# Patient Record
Sex: Female | Born: 1995 | Race: White | Hispanic: No | Smoking: Never smoker
Health system: Southern US, Community
[De-identification: ages and names within clinical notes are randomized; demographics above are authoritative.]

## PROBLEM LIST (undated history)

## (undated) DIAGNOSIS — N912 Amenorrhea, unspecified: Secondary | ICD-10-CM

## (undated) DIAGNOSIS — Z789 Other specified health status: Secondary | ICD-10-CM

## (undated) HISTORY — PX: FOOT SURGERY: SHX648

## (undated) HISTORY — DX: Amenorrhea, unspecified: N91.2

## (undated) HISTORY — DX: Other specified health status: Z78.9

## (undated) HISTORY — PX: PARTIAL MASTECTOMY: SHX2703

---
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
## 2014-01-31 ENCOUNTER — Ambulatory Visit: Payer: Self-pay | Admitting: Family Medicine

## 2015-05-22 ENCOUNTER — Telehealth: Payer: Self-pay | Admitting: Obstetrics and Gynecology

## 2015-05-22 ENCOUNTER — Other Ambulatory Visit: Payer: Self-pay | Admitting: *Deleted

## 2015-05-22 ENCOUNTER — Other Ambulatory Visit: Payer: Self-pay | Admitting: Obstetrics and Gynecology

## 2015-05-22 MED ORDER — CLOTRIMAZOLE-BETAMETHASONE 1-0.05 % EX CREA: 1.0000 "application " | TOPICAL_CREAM | Freq: Two times a day (BID) | CUTANEOUS | Status: DC

## 2015-05-22 NOTE — Telephone Encounter (Signed)
[place], [place] DID NOT HAVE THE R[place], RED, IRRATATED AND ITCHY, ON THE SIDE AND GOING UP, IT IS SPREADING FROM YESTERDAY, SHE I SUSING AN ANTIBIOTIC CREAM, THE CREAM WAS GIVEN TO THE MOTHER FOR A SPIDER BIT AND SHE THOUGHT IT WOULD HELP CLEAR IT UP, NO FEVER AND NO NVD, WANTED TO KNOW IF SHE NEEDED TO COME IN AND BE SEEN

## 2015-05-22 NOTE — Telephone Encounter (Signed)
Printer RX for a different cream, apply as directed and if not resolved within 1 week want her to come in to be seen

## 2015-05-22 NOTE — Telephone Encounter (Signed)
Called mother and advised of MNB message rx faxed to [facility] [address]

## 2015-08-27 ENCOUNTER — Telehealth: Payer: Self-pay | Admitting: Obstetrics and Gynecology

## 2015-08-27 NOTE — Telephone Encounter (Signed)
She is going to [place] and there timing of taking her pill will be off she needs to know how to take.

## 2015-08-27 NOTE — Telephone Encounter (Signed)
pls advise

## 2015-08-29 NOTE — Telephone Encounter (Signed)
Please call mom [phone] today, about how Natalie Walsh should take her BC pills since she is going to [place]. Natalie Walsh is in school today

## 2015-08-29 NOTE — Telephone Encounter (Signed)
She can take them continuously omitting placebo pills to prevent period while she is there. Does she need pharmacy autherization to give enough packs to do that? If so, how long will she be gone?

## 2015-08-30 NOTE — Telephone Encounter (Signed)
Notified mother she voiced understanding.

## 2015-09-27 DIAGNOSIS — J029 Acute pharyngitis, unspecified: Secondary | ICD-10-CM | POA: Diagnosis not present

## 2015-09-27 DIAGNOSIS — J069 Acute upper respiratory infection, unspecified: Secondary | ICD-10-CM | POA: Diagnosis not present

## 2015-10-04 ENCOUNTER — Telehealth: Payer: Self-pay | Admitting: Obstetrics and Gynecology

## 2015-10-04 ENCOUNTER — Other Ambulatory Visit: Payer: Self-pay | Admitting: Obstetrics and Gynecology

## 2015-10-04 MED ORDER — ONDANSETRON 4 MG PO TBDP: 4.0000 mg | ORAL_TABLET | Freq: Four times a day (QID) | ORAL | Status: DC | PRN

## 2015-10-04 NOTE — Telephone Encounter (Signed)
Please fax rx for her

## 2015-10-04 NOTE — Telephone Encounter (Signed)
Natalie Walsh is fixing to get on a plane to come home from college. She had a stomach virus and her mom wants to know if you can send an RX for nausea to a pharmacy where she is so she is at ([facility]- [phone]) [place], [place]

## 2015-10-05 NOTE — Telephone Encounter (Signed)
Faxed to correct pharmacy 

## 2015-11-09 DIAGNOSIS — R5383 Other fatigue: Secondary | ICD-10-CM

## 2015-11-21 ENCOUNTER — Ambulatory Visit (INDEPENDENT_AMBULATORY_CARE_PROVIDER_SITE_OTHER): Payer: BLUE CROSS/BLUE SHIELD | Admitting: Obstetrics and Gynecology

## 2015-11-21 ENCOUNTER — Encounter: Payer: Self-pay | Admitting: Obstetrics and Gynecology

## 2015-11-21 VITALS — BP 120/67 | HR 59 | Ht 68.0 in | Wt 147.5 lb

## 2015-11-21 DIAGNOSIS — R5383 Other fatigue: Secondary | ICD-10-CM

## 2015-11-21 DIAGNOSIS — G479 Sleep disorder, unspecified: Secondary | ICD-10-CM | POA: Diagnosis not present

## 2015-11-21 DIAGNOSIS — N911 Secondary amenorrhea: Secondary | ICD-10-CM

## 2015-11-21 MED ORDER — CLONAZEPAM 0.5 MG PO TABS: 0.5000 mg | ORAL_TABLET | Freq: Every day | ORAL | Status: DC

## 2015-11-21 NOTE — Progress Notes (Signed)
HPI Reports onset fatigue and decreased endurance in Nov. Feels like it has gotten worse over last few months. 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 Not sleeping well, and doesn't feel rested when she gets up, wakes up multiple times a night- tried Melatonin last 2 weeks with no improvement. Also started on some supplements by holistic doctor and trainer and can't tell a difference. Had labs drawn 3 weeks ago and told she had signs of adrenal fatigue and is overworked training.  Feels depressed over inability to keep up with teammates  Review of Systems See above    Objective:   Physical Exam A&O x4  well groomed female in no distress  HRR lung clear Thyroid normal  Abdomen soft and nontender Blood pressure 127/72, pulse 67, weight 180 lb 4.8 oz (81.784 kg), last menstrual period 11/14/2015.    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 Recheck in 2 weeks, and instructed patient to bring previous labs results and current OTC supplement meds with her to review.  Natalie Walsh [place], CNM

## 2015-11-22 ENCOUNTER — Telehealth: Payer: Self-pay | Admitting: *Deleted

## 2015-11-22 LAB — CBC
HEMOGLOBIN: 14.5 g/dL (ref 11.1–15.9)
Hematocrit: 43.6 % (ref 34.0–46.6)
MCH: 30.1 pg (ref 26.6–33.0)
MCHC: 33.3 g/dL (ref 31.5–35.7)
MCV: 91 fL (ref 79–97)
Platelets: 321 10*3/uL (ref 150–379)
RBC: 4.82 x10E6/uL (ref 3.77–5.28)
RDW: 13.3 % (ref 12.3–15.4)
WBC: 8.3 10*3/uL (ref 3.4–10.8)

## 2015-11-22 LAB — COMPREHENSIVE METABOLIC PANEL
ALBUMIN: 4.7 g/dL (ref 3.5–5.5)
ALT: 18 IU/L (ref 0–32)
AST: 26 IU/L (ref 0–40)
Albumin/Globulin Ratio: 1.6 (ref 1.1–2.5)
Alkaline Phosphatase: 59 IU/L (ref 39–117)
BUN / CREAT RATIO: 23 — AB (ref 8–20)
BUN: 18 mg/dL (ref 6–20)
Bilirubin Total: 0.5 mg/dL (ref 0.0–1.2)
CO2: 20 mmol/L (ref 18–29)
CREATININE: 0.78 mg/dL (ref 0.57–1.00)
Calcium: 9.8 mg/dL (ref 8.7–10.2)
Chloride: 99 mmol/L (ref 96–106)
GFR, EST AFRICAN AMERICAN: 126 mL/min/{1.73_m2} (ref >59)
GFR, EST NON AFRICAN AMERICAN: 109 mL/min/{1.73_m2} (ref >59)
GLUCOSE: 70 mg/dL (ref 65–99)
Globulin, Total: 3 g/dL (ref 1.5–4.5)
Potassium: 4.7 mmol/L (ref 3.5–5.2)
Sodium: 139 mmol/L (ref 134–144)
TOTAL PROTEIN: 7.7 g/dL (ref 6.0–8.5)

## 2015-11-22 LAB — THYROID PANEL WITH TSH
FREE THYROXINE INDEX: 2.3 (ref 1.2–4.9)
T3 UPTAKE RATIO: 25 % (ref 24–39)
T4, Total: 9.3 ug/dL (ref 4.5–12.0)
TSH: 1.55 u[IU]/mL (ref 0.450–4.500)

## 2015-11-22 LAB — PROLACTIN: PROLACTIN: 7 ng/mL (ref 4.8–23.3)

## 2015-11-22 LAB — VITAMIN D 25 HYDROXY (VIT D DEFICIENCY, FRACTURES): Vit D, 25-Hydroxy: 62.1 ng/mL (ref 30.0–100.0)

## 2015-11-22 LAB — CORTISOL: CORTISOL: 8.1 ug/dL

## 2015-11-22 LAB — VITAMIN B12: Vitamin B-12: 539 pg/mL (ref 211–946)

## 2015-11-22 LAB — IRON: Iron: 99 ug/dL (ref 27–159)

## 2015-11-22 LAB — ACTH

## 2015-11-22 NOTE — Telephone Encounter (Signed)
Left pt detailed message about her labs 

## 2015-11-22 NOTE — Telephone Encounter (Signed)
-----   Message from Purcell Nails, [place] sent at 11/22/2015  3:40 PM EST ----- Please let her know all labs were normal.

## 2015-11-24 ENCOUNTER — Encounter: Payer: Self-pay | Admitting: Obstetrics and Gynecology

## 2015-12-11 ENCOUNTER — Ambulatory Visit (INDEPENDENT_AMBULATORY_CARE_PROVIDER_SITE_OTHER): Payer: BLUE CROSS/BLUE SHIELD | Admitting: Obstetrics and Gynecology

## 2015-12-11 ENCOUNTER — Encounter: Payer: Self-pay | Admitting: Obstetrics and Gynecology

## 2015-12-11 VITALS — BP 118/68 | HR 64 | Wt 148.5 lb

## 2015-12-11 DIAGNOSIS — Z5181 Encounter for therapeutic drug level monitoring: Secondary | ICD-10-CM | POA: Diagnosis not present

## 2015-12-11 NOTE — Progress Notes (Signed)
Patient ID: Natalie Walsh, female   DOB: 1995-11-16, 20 y.o.   MRN: [id number]   Here for medication follow-up from 2 weeks ago:  S: reports slept much better with Klonopin use, but stopped it as her parents didn't want her taking it. Feels like she has more energy since starting Ashwaghanda. Has been working on stress reduction and prioritizing, and feels more in control of things.  O: A&O x4  well groomed female in no distress Blood pressure 118/68, pulse 64, weight 148 lb 8 oz (67.359 kg).  A: fatigue improved  P: will use Klonopin on prn basis as directed. To continue Ashwaghanda daily for now Already has AE scheduled.  Melody [place], CNM

## 2016-01-08 ENCOUNTER — Encounter: Payer: Self-pay | Admitting: Obstetrics and Gynecology

## 2016-01-14 ENCOUNTER — Ambulatory Visit (INDEPENDENT_AMBULATORY_CARE_PROVIDER_SITE_OTHER): Payer: BLUE CROSS/BLUE SHIELD | Admitting: Family Medicine

## 2016-01-14 ENCOUNTER — Encounter: Payer: Self-pay | Admitting: Family Medicine

## 2016-01-14 DIAGNOSIS — M79672 Pain in left foot: Secondary | ICD-10-CM | POA: Diagnosis not present

## 2016-01-14 NOTE — Progress Notes (Signed)
Patient ID: Natalie Walsh, female   DOB: 07/16/1996, 20 y.o.   MRN: [id number] Patient presents today with symptoms of left foot pain. Patient states that she dropped a 5 pound weight on her left foot back in November 2016. She treated it like a contusion at the time and ice the foot. She then went to grease and did a lot of running and walking. She is continued to feel some discomfort in the foot since the injury. She denies any new injury since that time. She has noticed the pain increased and she is started out or track and field. She competes in hurdles. She denies any history of stress injury to her foot in the past. She does not wear orthotics. She was put in a walking boot for a short period of time by the trainer which did help. She denies any swelling recently or bruising.  ROS: Negative except mentioned above.  Vitals as per Epic.  GENERAL: NAD RESP: CTA B CARD: RRR MSK: Left Foot: pes planus with overpronation, mild to moderate tenderness along navicular and proximal 1st metatarsal, no ecchymosis or swelling,FROM, nv intact  NEURO: CN II-XII grossly intact   A/P: Hx of left foot injury with chronic pain- will do x-rays initially, if negative will proceed with doing MRI given location of pain and chronicity of symptoms. I recommend that she go into a walking boot at this time. I discussed with her on getting off the shelf orthotics. She can take NSAIDs if needed for pain. Discussed changing her running shoes every 400 or so miles. Will discuss plan above with trainer.

## 2016-01-16 ENCOUNTER — Ambulatory Visit
Admission: RE | Admit: 2016-01-16 | Discharge: 2016-01-16 | Disposition: A | Discharge status: Home / Self Care | Payer: BLUE CROSS/BLUE SHIELD | Source: Ambulatory Visit | Attending: Family Medicine | Admitting: Family Medicine

## 2016-01-16 DIAGNOSIS — M79672 Pain in left foot: Secondary | ICD-10-CM | POA: Insufficient documentation

## 2016-01-17 ENCOUNTER — Other Ambulatory Visit: Payer: Self-pay | Admitting: Family Medicine

## 2016-01-17 DIAGNOSIS — G8929 Other chronic pain: Secondary | ICD-10-CM

## 2016-01-17 DIAGNOSIS — M79672 Pain in left foot: Principal | ICD-10-CM

## 2016-01-24 ENCOUNTER — Ambulatory Visit
Admission: RE | Admit: 2016-01-24 | Discharge: 2016-01-24 | Disposition: A | Discharge status: Home / Self Care | Payer: BLUE CROSS/BLUE SHIELD | Source: Ambulatory Visit | Attending: Family Medicine | Admitting: Family Medicine

## 2016-01-24 DIAGNOSIS — T148 Other injury of unspecified body region: Secondary | ICD-10-CM | POA: Insufficient documentation

## 2016-01-24 DIAGNOSIS — G8929 Other chronic pain: Secondary | ICD-10-CM | POA: Diagnosis present

## 2016-01-24 DIAGNOSIS — M79672 Pain in left foot: Secondary | ICD-10-CM | POA: Insufficient documentation

## 2016-02-05 ENCOUNTER — Other Ambulatory Visit: Payer: Self-pay | Admitting: Family Medicine

## 2016-02-05 DIAGNOSIS — M79672 Pain in left foot: Secondary | ICD-10-CM

## 2016-02-06 ENCOUNTER — Ambulatory Visit
Admission: RE | Admit: 2016-02-06 | Discharge: 2016-02-06 | Disposition: A | Discharge status: Home / Self Care | Payer: BLUE CROSS/BLUE SHIELD | Source: Ambulatory Visit | Attending: Family Medicine | Admitting: Family Medicine

## 2016-02-06 DIAGNOSIS — M79672 Pain in left foot: Secondary | ICD-10-CM

## 2016-02-06 DIAGNOSIS — M25775 Osteophyte, left foot: Secondary | ICD-10-CM | POA: Diagnosis not present

## 2016-02-06 DIAGNOSIS — M84375A Stress fracture, left foot, initial encounter for fracture: Secondary | ICD-10-CM | POA: Insufficient documentation

## 2016-02-06 DIAGNOSIS — X58XXXA Exposure to other specified factors, initial encounter: Secondary | ICD-10-CM | POA: Insufficient documentation

## 2016-02-11 ENCOUNTER — Encounter: Payer: Self-pay | Admitting: Family Medicine

## 2016-02-11 ENCOUNTER — Ambulatory Visit (INDEPENDENT_AMBULATORY_CARE_PROVIDER_SITE_OTHER): Payer: BLUE CROSS/BLUE SHIELD | Admitting: Family Medicine

## 2016-02-11 VITALS — BP 119/71 | HR 69 | Resp 14

## 2016-02-11 DIAGNOSIS — M84375A Stress fracture, left foot, initial encounter for fracture: Secondary | ICD-10-CM

## 2016-02-11 NOTE — Progress Notes (Signed)
Patient ID: Apolonio SchneidersLydia Walsh, female   DOB: 02/05/1996, 20 y.o.   MRN: [id number] Patient presents today regarding her left navicular stress fracture. Patient states that she has no questions and is okay with proceeding with the non-surgical route of treatment for now. The plan is for her to be nonweightbearing in a boot and follow-up with Dr. Ardine Engiehl this summer. If she continues to have pain or no healing at 3 months then surgical treatment will be likely. I did check and see if she had a vitamin D level drawn recently. It appears that she had a vitamin D level drawn in February 2017. The level was 61. I do not feel that there is any need for supplementation a vitamin D at this point. Dr. Ardine Engiehl did discuss with me on ordering a bone stimulator. I will discuss this further with the trainer and write an order if needed.

## 2016-02-14 ENCOUNTER — Encounter: Payer: Self-pay | Admitting: *Deleted

## 2016-02-20 ENCOUNTER — Other Ambulatory Visit: Payer: Self-pay | Admitting: Obstetrics and Gynecology

## 2016-02-20 ENCOUNTER — Encounter: Payer: Self-pay | Admitting: Obstetrics and Gynecology

## 2016-02-20 ENCOUNTER — Ambulatory Visit (INDEPENDENT_AMBULATORY_CARE_PROVIDER_SITE_OTHER): Payer: BLUE CROSS/BLUE SHIELD | Admitting: Obstetrics and Gynecology

## 2016-02-20 DIAGNOSIS — N926 Irregular menstruation, unspecified: Secondary | ICD-10-CM

## 2016-02-20 DIAGNOSIS — Z01419 Encounter for gynecological examination (general) (routine) without abnormal findings: Secondary | ICD-10-CM

## 2016-02-20 NOTE — Progress Notes (Signed)
  Subjective:     Natalie Walsh is a 20 y.o. female and is here for a comprehensive physical exam. The patient reports no problems.  Social History   Social History  . Marital Status: Single    Spouse Name: N/A  . Number of Children: N/A  . Years of Education: N/A   Occupational History  . Not on file.   Social History Main Topics  . Smoking status: Never Smoker   . Smokeless tobacco: Never Used  . Alcohol Use: No  . Drug Use: No  . Sexual Activity: Not Currently   Other Topics Concern  . Not on file   Social History Narrative   Health Maintenance  Topic Date Due  . HIV Screening  11/20/2009  . TETANUS/TDAP  11/20/2013  . PAP SMEAR  11/21/2015  . INFLUENZA VACCINE  05/13/2016    The following portions of the patient's history were reviewed and updated as appropriate: allergies, current medications, past family history, past medical history, past social history, past surgical history and problem list.  Review of Systems A comprehensive review of systems was negative.   Objective:    General appearance: alert, cooperative and appears stated age Neck: no adenopathy, no carotid bruit, no JVD, supple, symmetrical, trachea midline and thyroid not enlarged, symmetric, no tenderness/mass/nodules Lungs: clear to auscultation bilaterally Breasts: normal appearance, no masses or tenderness Heart: regular rate and rhythm, S1, S2 normal, no murmur, click, rub or gallop Abdomen: soft, non-tender; bowel sounds normal; no masses,  no organomegaly Pelvic: cervix normal in appearance, external genitalia normal, no adnexal masses or tenderness, no cervical motion tenderness, rectovaginal septum normal, uterus normal size, shape, and consistency and vagina normal without discharge    Assessment:    Healthy female exam. H/O irregular menses      Plan:  Will chart menses and let me know if skips >3 months   See After Visit Summary for Counseling Recommendations

## 2016-02-20 NOTE — Patient Instructions (Signed)
Place annual gynecologic exam patient instructions here.

## 2016-02-22 LAB — CYTOLOGY - PAP

## 2016-03-14 ENCOUNTER — Other Ambulatory Visit: Payer: Self-pay | Admitting: Family Medicine

## 2016-03-14 DIAGNOSIS — S92902P Unspecified fracture of left foot, subsequent encounter for fracture with malunion: Secondary | ICD-10-CM

## 2016-03-17 ENCOUNTER — Ambulatory Visit
Admission: RE | Admit: 2016-03-17 | Discharge: 2016-03-17 | Disposition: A | Discharge status: Home / Self Care | Payer: BLUE CROSS/BLUE SHIELD | Source: Ambulatory Visit | Attending: Family Medicine | Admitting: Family Medicine

## 2016-03-17 DIAGNOSIS — X58XXXD Exposure to other specified factors, subsequent encounter: Secondary | ICD-10-CM | POA: Insufficient documentation

## 2016-03-17 DIAGNOSIS — S92902D Unspecified fracture of left foot, subsequent encounter for fracture with routine healing: Secondary | ICD-10-CM | POA: Insufficient documentation

## 2016-03-17 DIAGNOSIS — S92902P Unspecified fracture of left foot, subsequent encounter for fracture with malunion: Secondary | ICD-10-CM

## 2016-04-08 ENCOUNTER — Ambulatory Visit
Admission: RE | Admit: 2016-04-08 | Discharge: 2016-04-08 | Disposition: A | Discharge status: Home / Self Care | Payer: BLUE CROSS/BLUE SHIELD | Source: Ambulatory Visit | Attending: Family Medicine | Admitting: Family Medicine

## 2016-04-08 ENCOUNTER — Other Ambulatory Visit: Payer: Self-pay | Admitting: Family Medicine

## 2016-04-08 DIAGNOSIS — M79672 Pain in left foot: Secondary | ICD-10-CM | POA: Diagnosis present

## 2016-04-08 DIAGNOSIS — R52 Pain, unspecified: Secondary | ICD-10-CM

## 2016-05-08 ENCOUNTER — Encounter: Payer: Self-pay | Admitting: Family Medicine

## 2016-05-08 ENCOUNTER — Ambulatory Visit (INDEPENDENT_AMBULATORY_CARE_PROVIDER_SITE_OTHER): Payer: BLUE CROSS/BLUE SHIELD | Admitting: Family Medicine

## 2016-05-08 DIAGNOSIS — M84375G Stress fracture, left foot, subsequent encounter for fracture with delayed healing: Secondary | ICD-10-CM

## 2016-05-08 NOTE — Progress Notes (Signed)
Patient presents today for follow-up regarding left navicular stress fracture. Patient states that she has been out of her walking boot for the last few days. Her course of treatment over the last few months has been approximately 1 month nonweightbearing in a boot with crutches, 6 weeks weightbearing in a boot. She has started using her bone stim about a week and a half ago.  Since being out of the boot now for a few days she has noticed at times a little soreness of the area. She states the pain is much more diminished than what it was at the start of the summer. When asked whether she had any pain walking from the parking lot to my office she said no. Denies any other problems at this time. Vitamin D level has been checked in the past and has been normal. Menstrual cycles are normal. No restrictive eating behavior.  ROS: Negative except mentioned above. Vitals as per Epic.  GENERAL: NAD RESP: CTA B CARD: RRR EXTREM: L Foot- mild tenderness proximal navicular area with deep palpation, FROM, nv intact  NEURO: CN II-XII grossly intact   A/P: L Navicular Stress Fracture- will continue to work on stretching the foot and strengthening. She can continue to do bone stim as well. Will keep her walking in a tennis shoe for now but if her symptoms of pain increase I've asked that she return to wearing the walking boot. Patient addresses understanding of plan. We will have her follow up with Dr. Ardine Eng in 1-2 weeks. If symptoms persist/worsen consider repeat CT of the foot and or referral to foot specialist.

## 2016-08-18 ENCOUNTER — Encounter: Payer: Self-pay | Admitting: Family Medicine

## 2016-08-18 ENCOUNTER — Ambulatory Visit (INDEPENDENT_AMBULATORY_CARE_PROVIDER_SITE_OTHER): Payer: BLUE CROSS/BLUE SHIELD | Admitting: Family Medicine

## 2016-08-18 DIAGNOSIS — S92252G Displaced fracture of navicular [scaphoid] of left foot, subsequent encounter for fracture with delayed healing: Secondary | ICD-10-CM

## 2016-08-18 NOTE — Progress Notes (Signed)
Patient presents today with symptoms of left foot pain. Patient has a history of left navicular foot stress fracture. Patient has been treated conservatively for this injury. She admits to getting back to track and field activity in mid September. She states that she was doing about 75% of practice for a few weeks without any significant pain. Patient states that she was doing 3 days of practice and 2-3 days of cross training She then started to notice some pain in the general area of the navicular again after activity. She is most recently started to notice pain with walking. Her athletic trainer put her back in a walking boot yesterday. Patient states that she has no pain in the boot. She denies having a vitamin D deficiency. She does not take any vitamin D supplement. Her last vitamin D level was 62 (11/2015). She has been using her bone stim on the area as well.   ROS: Negative except mentioned above.  GENERAL: NAD MSK: L Foot - no obvious swelling, no ecchymosis, patient does have tenderness in the navicular area on palpation, full range of motion, NV intact NEURO: CN II-XII grossly intact   A/P: Left foot pain with history of navicular stress fracture: Given patient's current symptoms and history would recommend doing imaging of the area we'll start with doing x-ray and then to a CT of the left foot. We'll review the CT and then have Razan see Dr. Ardine Engiehl to see if patient needs to be referred to foot/ankle specialist. Patient will stay in the walking boot for now as she does not have any pain in the boot. Any acute worsening symptoms will follow-up.

## 2016-08-19 ENCOUNTER — Other Ambulatory Visit: Payer: Self-pay | Admitting: Family Medicine

## 2016-08-19 ENCOUNTER — Ambulatory Visit
Admission: RE | Admit: 2016-08-19 | Discharge: 2016-08-19 | Disposition: A | Discharge status: Home / Self Care | Payer: BLUE CROSS/BLUE SHIELD | Source: Ambulatory Visit | Attending: Family Medicine | Admitting: Family Medicine

## 2016-08-19 DIAGNOSIS — S92252G Displaced fracture of navicular [scaphoid] of left foot, subsequent encounter for fracture with delayed healing: Secondary | ICD-10-CM

## 2016-08-19 DIAGNOSIS — M84375D Stress fracture, left foot, subsequent encounter for fracture with routine healing: Secondary | ICD-10-CM | POA: Diagnosis present

## 2016-08-19 DIAGNOSIS — M79672 Pain in left foot: Secondary | ICD-10-CM | POA: Insufficient documentation

## 2016-08-19 DIAGNOSIS — S92255G Nondisplaced fracture of navicular [scaphoid] of left foot, subsequent encounter for fracture with delayed healing: Secondary | ICD-10-CM

## 2016-08-22 ENCOUNTER — Ambulatory Visit
Admission: RE | Admit: 2016-08-22 | Discharge: 2016-08-22 | Disposition: A | Discharge status: Home / Self Care | Payer: BLUE CROSS/BLUE SHIELD | Source: Ambulatory Visit | Attending: Family Medicine | Admitting: Family Medicine

## 2016-08-22 DIAGNOSIS — Z87312 Personal history of (healed) stress fracture: Secondary | ICD-10-CM | POA: Diagnosis not present

## 2016-08-22 DIAGNOSIS — R600 Localized edema: Secondary | ICD-10-CM | POA: Insufficient documentation

## 2016-08-22 DIAGNOSIS — M25572 Pain in left ankle and joints of left foot: Secondary | ICD-10-CM | POA: Diagnosis present

## 2016-08-22 DIAGNOSIS — S92255G Nondisplaced fracture of navicular [scaphoid] of left foot, subsequent encounter for fracture with delayed healing: Secondary | ICD-10-CM

## 2016-12-11 ENCOUNTER — Other Ambulatory Visit: Payer: Self-pay | Admitting: Orthopaedic Surgery

## 2016-12-11 DIAGNOSIS — S92255K Nondisplaced fracture of navicular [scaphoid] of left foot, subsequent encounter for fracture with nonunion: Secondary | ICD-10-CM

## 2016-12-12 ENCOUNTER — Encounter: Payer: Self-pay | Admitting: Obstetrics and Gynecology

## 2016-12-12 ENCOUNTER — Ambulatory Visit (INDEPENDENT_AMBULATORY_CARE_PROVIDER_SITE_OTHER): Payer: BLUE CROSS/BLUE SHIELD | Admitting: Obstetrics and Gynecology

## 2016-12-12 VITALS — BP 121/59 | HR 68 | Ht 68.0 in | Wt 158.4 lb

## 2016-12-12 DIAGNOSIS — N911 Secondary amenorrhea: Secondary | ICD-10-CM | POA: Diagnosis not present

## 2016-12-12 NOTE — Patient Instructions (Signed)
Polycystic Ovarian Syndrome Polycystic ovarian syndrome (PCOS) is a common hormonal disorder among women of reproductive age. In most women with PCOS, many small fluid-filled sacs (cysts) grow on the ovaries, and the cysts are not part of a normal menstrual cycle. PCOS can cause problems with your menstrual periods and make it difficult to get pregnant. It can also cause an increased risk of miscarriage with pregnancy. If it is not treated, PCOS can lead to serious health problems, such as diabetes and heart disease. What are the causes? The cause of PCOS is not known, but it may be the result of a combination of certain factors, such as:  Irregular menstrual cycle.  High levels of certain hormones (androgens).  Problems with the hormone that helps to control blood sugar (insulin resistance).  Certain genes. What increases the risk? This condition is more likely to develop in women who have a family history of PCOS. What are the signs or symptoms? Symptoms of PCOS may include:  Multiple ovarian cysts.  Infrequent periods or no periods.  Periods that are too frequent or too heavy.  Unpredictable periods.  Inability to get pregnant (infertility) because of not ovulating.  Increased growth of hair on the face, chest, stomach, back, thumbs, thighs, or toes.  Acne or oily skin. Acne may develop during adulthood, and it may not respond to treatment.  Pelvic pain.  Weight gain or obesity.  Patches of thickened and dark brown or black skin on the neck, arms, breasts, or thighs (acanthosis nigricans).  Excess hair growth on the face, chest, abdomen, or upper thighs (hirsutism). How is this diagnosed? This condition is diagnosed based on:  Your medical history.  A physical exam, including a pelvic exam. Your health care provider may look for areas of increased hair growth on your skin.  Tests, such as:  Ultrasound. This may be used to examine the ovaries and the lining of the  uterus (endometrium) for cysts.  Blood tests. These may be used to check levels of sugar (glucose), female hormone (testosterone), and female hormones (estrogen and progesterone) in your blood. How is this treated? There is no cure for PCOS, but treatment can help to manage symptoms and prevent more health problems from developing. Treatment varies depending on:  Your symptoms.  Whether you want to have a baby or whether you need birth control (contraception). Treatment may include nutrition and lifestyle changes along with:  Progesterone hormone to start a menstrual period.  Birth control pills to help you have regular menstrual periods.  Medicines to make you ovulate, if you want to get pregnant.  Medicine to reduce excessive hair growth.  Surgery, in severe cases. This may involve making small holes in one or both of your ovaries. This decreases the amount of testosterone that your body produces. Follow these instructions at home:  Take over-the-counter and prescription medicines only as told by your health care provider.  Follow a healthy meal plan. This can help you reduce the effects of PCOS.  Eat a healthy diet that includes lean proteins, complex carbohydrates, fresh fruits and vegetables, low-fat dairy products, and healthy fats. Make sure to eat enough fiber.  If you are overweight, lose weight as told by your health care provider.  Losing 10% of your body weight may improve symptoms.  Your health care provider can determine how much weight loss is best for you and can help you lose weight safely.  Keep all follow-up visits as told by your health care provider. This  is important. Contact a health care provider if:  Your symptoms do not get better with medicine.  You develop new symptoms. This information is not intended to replace advice given to you by your health care provider. Make sure you discuss any questions you have with your health care provider. Document  Released: 01/23/2005 Document Revised: 05/27/2016 Document Reviewed: 03/16/2016 Elsevier Interactive Patient Education  2017 Elsevier Inc.  

## 2016-12-12 NOTE — Progress Notes (Signed)
Subjective:     Patient ID: Natalie Walsh, female   DOB: Jul 17, 1996, 21 y.o.   MRN: [id number]  HPI  Reports no menses since May 2017, has occasional cramping like she is going to start but no bleeding at all. Stopped OCPs in March 2017. Denies any other symptoms.   Review of Systems Negative except stated above in HPI    Objective:   Physical Exam A&O x4 Well groomed female in nodistress Blood pressure (!) 121/59, pulse 68, height _0  (1.727 m), weight 158 lb 6.4 oz (71.8 kg), last menstrual period 03/08/2016. Thyroid normal on exam HRR Pelvic exam not indicated     Assessment:     Secondary amenorrhea    Plan:     PCOS labs obtained- will follow up accordingly Pelvic ultrasound ordered- will follow up a week after to discuss findings.  Lavida Patch [place], CNM

## 2016-12-17 ENCOUNTER — Other Ambulatory Visit: Payer: BLUE CROSS/BLUE SHIELD

## 2016-12-17 ENCOUNTER — Other Ambulatory Visit (INDEPENDENT_AMBULATORY_CARE_PROVIDER_SITE_OTHER): Payer: BLUE CROSS/BLUE SHIELD

## 2016-12-17 DIAGNOSIS — N911 Secondary amenorrhea: Secondary | ICD-10-CM

## 2016-12-18 ENCOUNTER — Ambulatory Visit
Admission: RE | Admit: 2016-12-18 | Discharge: 2016-12-18 | Disposition: A | Discharge status: Home / Self Care | Payer: BLUE CROSS/BLUE SHIELD | Source: Ambulatory Visit | Attending: Orthopaedic Surgery | Admitting: Orthopaedic Surgery

## 2016-12-18 DIAGNOSIS — X58XXXD Exposure to other specified factors, subsequent encounter: Secondary | ICD-10-CM | POA: Diagnosis not present

## 2016-12-18 DIAGNOSIS — M810 Age-related osteoporosis without current pathological fracture: Secondary | ICD-10-CM | POA: Insufficient documentation

## 2016-12-18 DIAGNOSIS — S92255K Nondisplaced fracture of navicular [scaphoid] of left foot, subsequent encounter for fracture with nonunion: Secondary | ICD-10-CM | POA: Diagnosis not present

## 2016-12-18 DIAGNOSIS — Z9889 Other specified postprocedural states: Secondary | ICD-10-CM | POA: Insufficient documentation

## 2016-12-18 LAB — COMPREHENSIVE METABOLIC PANEL
ALBUMIN: 4.4 g/dL (ref 3.5–5.5)
ALK PHOS: 58 IU/L (ref 39–117)
ALT: 28 IU/L (ref 0–32)
AST: 32 IU/L (ref 0–40)
Albumin/Globulin Ratio: 1.6 (ref 1.2–2.2)
BUN/Creatinine Ratio: 14 (ref 9–23)
BUN: 13 mg/dL (ref 6–20)
Bilirubin Total: 0.8 mg/dL (ref 0.0–1.2)
CO2: 25 mmol/L (ref 18–29)
CREATININE: 0.91 mg/dL (ref 0.57–1.00)
Calcium: 9.5 mg/dL (ref 8.7–10.2)
Chloride: 102 mmol/L (ref 96–106)
GFR calc Af Amer: 104 mL/min/{1.73_m2} (ref >59)
GFR calc non Af Amer: 90 mL/min/{1.73_m2} (ref >59)
GLUCOSE: 78 mg/dL (ref 65–99)
Globulin, Total: 2.7 g/dL (ref 1.5–4.5)
Potassium: 4.3 mmol/L (ref 3.5–5.2)
Sodium: 141 mmol/L (ref 134–144)
Total Protein: 7.1 g/dL (ref 6.0–8.5)

## 2016-12-18 LAB — CBC
HEMATOCRIT: 42.4 % (ref 34.0–46.6)
Hemoglobin: 14.1 g/dL (ref 11.1–15.9)
MCH: 30.8 pg (ref 26.6–33.0)
MCHC: 33.3 g/dL (ref 31.5–35.7)
MCV: 93 fL (ref 79–97)
PLATELETS: 251 10*3/uL (ref 150–379)
RBC: 4.58 x10E6/uL (ref 3.77–5.28)
RDW: 13.3 % (ref 12.3–15.4)
WBC: 9.1 10*3/uL (ref 3.4–10.8)

## 2016-12-18 LAB — B12 AND FOLATE PANEL: Vitamin B-12: 872 pg/mL (ref 232–1245)

## 2016-12-18 LAB — THYROID PANEL WITH TSH
Free Thyroxine Index: 1.5 (ref 1.2–4.9)
T3 Uptake Ratio: 28 % (ref 24–39)
T4 TOTAL: 5.2 ug/dL (ref 4.5–12.0)
TSH: 1.07 u[IU]/mL (ref 0.450–4.500)

## 2016-12-18 LAB — PROLACTIN: PROLACTIN: 7.2 ng/mL (ref 4.8–23.3)

## 2016-12-18 LAB — TESTOSTERONE, FREE, TOTAL, SHBG
SEX HORMONE BINDING: 83.3 nmol/L (ref 24.6–122.0)
TESTOSTERONE FREE: 2.6 pg/mL (ref 0.0–4.2)
TESTOSTERONE: 14 ng/dL (ref 8–48)

## 2016-12-18 LAB — FERRITIN: FERRITIN: 50 ng/mL (ref 15–150)

## 2016-12-18 LAB — ESTRADIOL: ESTRADIOL: 48.9 pg/mL

## 2016-12-18 LAB — VITAMIN D 25 HYDROXY (VIT D DEFICIENCY, FRACTURES): VIT D 25 HYDROXY: 57.2 ng/mL (ref 30.0–100.0)

## 2016-12-18 LAB — INSULIN, RANDOM: INSULIN: 6.5 u[IU]/mL (ref 2.6–24.9)

## 2016-12-18 LAB — DHEA-SULFATE: DHEA SO4: 135.7 ug/dL (ref 110.0–431.7)

## 2016-12-18 LAB — FSH/LH
FSH: 0.8 m[IU]/mL
LH: 0.8 m[IU]/mL

## 2016-12-18 LAB — PROGESTERONE: Progesterone: 0.3 ng/mL

## 2016-12-31 ENCOUNTER — Ambulatory Visit (INDEPENDENT_AMBULATORY_CARE_PROVIDER_SITE_OTHER): Payer: BLUE CROSS/BLUE SHIELD | Admitting: Obstetrics and Gynecology

## 2016-12-31 ENCOUNTER — Encounter: Payer: Self-pay | Admitting: Obstetrics and Gynecology

## 2016-12-31 VITALS — BP 107/61 | HR 82 | Wt 158.7 lb

## 2016-12-31 DIAGNOSIS — N83202 Unspecified ovarian cyst, left side: Secondary | ICD-10-CM

## 2016-12-31 MED ORDER — LO LOESTRIN FE 1 MG-10 MCG / 10 MCG PO TABS: 1.0000 | ORAL_TABLET | Freq: Every day | ORAL | 2 refills | Status: DC

## 2016-12-31 NOTE — Patient Instructions (Signed)
Ovarian Cyst  An ovarian cyst is a fluid-filled sac that forms on an ovary. The ovaries are small organs that produce eggs in women. Various types of cysts can form on the ovaries. Some may cause symptoms and require treatment. Most ovarian cysts go away on their own, are not cancerous (are benign), and do not cause problems. Common types of ovarian cysts include:  Functional (follicle) cysts.  Occur during the menstrual cycle, and usually go away with the next menstrual cycle if you do not get pregnant.  Usually cause no symptoms.  Endometriomas.  Are cysts that form from the tissue that lines the uterus (endometrium).  Are sometimes called "chocolate cysts" because they become filled with blood that turns brown.  Can cause pain in the lower abdomen during intercourse and during your period.  Cystadenoma cysts.  Develop from cells on the outside surface of the ovary.  Can get very large and cause lower abdomen pain and pain with intercourse.  Can cause severe pain if they twist or break open (rupture).  Dermoid cysts.  Are sometimes found in both ovaries.  May contain different kinds of body tissue, such as skin, teeth, hair, or cartilage.  Usually do not cause symptoms unless they get very big.  Theca lutein cysts.  Occur when too much of a certain hormone (human chorionic gonadotropin) is produced and overstimulates the ovaries to produce an egg.  Are most common after having procedures used to assist with the conception of a baby (in vitro fertilization). What are the causes? Ovarian cysts may be caused by:  Ovarian hyperstimulation syndrome. This is a condition that can develop from taking fertility medicines. It causes multiple large ovarian cysts to form.  Polycystic ovarian syndrome (PCOS). This is a common hormonal disorder that can cause ovarian cysts, as well as problems with your period or fertility. What increases the risk? The following factors may make you  more likely to develop ovarian cysts:  Being overweight or obese.  Taking fertility medicines.  Taking certain forms of hormonal birth control.  Smoking. What are the signs or symptoms? Many ovarian cysts do not cause symptoms. If symptoms are present, they may include:  Pelvic pain or pressure.  Pain in the lower abdomen.  Pain during sex.  Abdominal swelling.  Abnormal menstrual periods.  Increasing pain with menstrual periods. How is this diagnosed? These cysts are commonly found during a routine pelvic exam. You may have tests to find out more about the cyst, such as:  Ultrasound.  X-ray of the pelvis.  CT scan.  MRI.  Blood tests. How is this treated? Many ovarian cysts go away on their own without treatment. Your health care provider may want to check your cyst regularly for 2-3 months to see if it changes. If you are in menopause, it is especially important to have your cyst monitored closely because menopausal women have a higher rate of ovarian cancer. When treatment is needed, it may include:  Medicines to help relieve pain.  A procedure to drain the cyst (aspiration).  Surgery to remove the whole cyst.  Hormone treatment or birth control pills. These methods are sometimes used to help dissolve a cyst. Follow these instructions at home:  Take over-the-counter and prescription medicines only as told by your health care provider.  Do not drive or use heavy machinery while taking prescription pain medicine.  Get regular pelvic exams and Pap tests as often as told by your health care provider.  Return to your   normal activities as told by your health care provider. Ask your health care provider what activities are safe for you.  Do not use any products that contain nicotine or tobacco, such as cigarettes and e-cigarettes. If you need help quitting, ask your health care provider.  Keep all follow-up visits as told by your health care provider. This is  important. Contact a health care provider if:  Your periods are late, irregular, or painful, or they stop.  You have pelvic pain that does not go away.  You have pressure on your bladder or trouble emptying your bladder completely.  You have pain during sex.  You have any of the following in your abdomen:  A feeling of fullness.  Pressure.  Discomfort.  Pain that does not go away.  Swelling.  You feel generally ill.  You become constipated.  You lose your appetite.  You develop severe acne.  You start to have more body hair and facial hair.  You are gaining weight or losing weight without changing your exercise and eating habits.  You think you may be pregnant. Get help right away if:  You have abdominal pain that is severe or gets worse.  You cannot eat or drink without vomiting.  You suddenly develop a fever.  Your menstrual period is much heavier than usual. This information is not intended to replace advice given to you by your health care provider. Make sure you discuss any questions you have with your health care provider. Document Released: 09/29/2005 Document Revised: 04/18/2016 Document Reviewed: 03/02/2016 Elsevier Interactive Patient Education  2017 Elsevier Inc.  

## 2016-12-31 NOTE — Progress Notes (Signed)
Here to review labs and ultrasound findings. Does report onset of menses day of ultrasound and it was slightly heavy and lasted for 5 days.  Ultrasound done on 12/17/16: Findings:  The uterus is anteverted and measures 7.4 x 3.3 x 3.5 cm. Echo texture is homogenous without evidence of focal masses.  The Endometrium appear WNL and measures 7.8 mm.  Right Ovary measures 3.6 x 2.1 x 2.1 cm. Multiple follicles are seen, but does not appear to have the appearance of a polycystic ovary. Left Ovary is slightly enlarged and measures 4.6 x 3.0 x 3.1 cm. There is a complex cystic mass involving the ovary measuring 3.4 x 2.8 x 2.4 cm. There is a thick septation seen. Good vascular flow is seen in the remaining ovarian tissue. No flow is seen in the septation. Survey of the adnexa demonstrates no adnexal masses. There is a small amount of free fluid in the cul de sac.  Impression: 1. Complex left ovarian cyst.  Right ovary appears WNL. 2. Normal appearing uterus and endometrium.  A: irregular menses Left ovarian cyst  P: restart OCP and will take continuously \\RTC  in 6 weeks for repeat ultrasound, and will follow up accordingly.  Melody ButlerShambley, CNM

## 2017-01-27 ENCOUNTER — Encounter: Payer: Self-pay | Admitting: Obstetrics and Gynecology

## 2017-02-11 ENCOUNTER — Ambulatory Visit (INDEPENDENT_AMBULATORY_CARE_PROVIDER_SITE_OTHER): Payer: BLUE CROSS/BLUE SHIELD

## 2017-02-11 ENCOUNTER — Ambulatory Visit (INDEPENDENT_AMBULATORY_CARE_PROVIDER_SITE_OTHER): Payer: BLUE CROSS/BLUE SHIELD | Admitting: Obstetrics and Gynecology

## 2017-02-11 ENCOUNTER — Encounter: Payer: Self-pay | Admitting: Obstetrics and Gynecology

## 2017-02-11 VITALS — BP 109/63 | HR 50 | Wt 157.3 lb

## 2017-02-11 DIAGNOSIS — N83202 Unspecified ovarian cyst, left side: Secondary | ICD-10-CM

## 2017-02-11 NOTE — Progress Notes (Signed)
Indications: F/U Complex Left Ovarian Cyst Findings:  The uterus is anteverted and measures 6.9 x 3.2 x 3.7 cm. Echo texture is homogenous without evidence of focal masses.  The Endometrium measures 4.0 mm.  Right Ovary measures 3.6 x 2.5 x 2.5 cm, and appears WNL. Left Ovary measures 3.1 x 1.7 x 2.7 cm. The complex cyst that was seen previously has now resolved. Multiple follicles seen, appears WNL. Survey of the adnexa demonstrates no adnexal masses. There is no free fluid in the cul de sac.  Impression: 1. Resolution of left ovarian cyst. 2. Normal appearing pelvic ultrasound.  Reviewed ultrasound findings. Patient feeling well. Desires to continue OCP at this time, and has refills at pharmacy.    Keena Dinse [place], CNM

## 2017-02-24 ENCOUNTER — Encounter: Payer: Self-pay | Admitting: Obstetrics and Gynecology

## 2017-02-24 ENCOUNTER — Other Ambulatory Visit: Payer: Self-pay | Admitting: Obstetrics and Gynecology

## 2017-02-24 ENCOUNTER — Ambulatory Visit (INDEPENDENT_AMBULATORY_CARE_PROVIDER_SITE_OTHER): Payer: BLUE CROSS/BLUE SHIELD | Admitting: Obstetrics and Gynecology

## 2017-02-24 VITALS — BP 110/64 | HR 80 | Ht 68.0 in | Wt 155.8 lb

## 2017-02-24 DIAGNOSIS — Z01419 Encounter for gynecological examination (general) (routine) without abnormal findings: Secondary | ICD-10-CM

## 2017-02-24 NOTE — Progress Notes (Signed)
   Subjective:     Apolonio SchneidersLydia Charter is a 21 y.o. female and is here for a comprehensive physical exam. The patient reports no problems. Single white female just graduated with bachelor degree in sports management, not currently employed. Sexually active in past but not now. Exercising regular.  Social History   Social History  . Marital status: Single    Spouse name: N/A  . Number of children: N/A  . Years of education: N/A   Occupational History  . Not on file.   Social History Main Topics  . Smoking status: Never Smoker  . Smokeless tobacco: Never Used  . Alcohol use No  . Drug use: No  . Sexual activity: Not Currently   Other Topics Concern  . Not on file   Social History Narrative  . No narrative on file   Health Maintenance  Topic Date Due  . HIV Screening  11/20/2009  . TETANUS/TDAP  11/20/2013  . INFLUENZA VACCINE  05/13/2017  . PAP SMEAR  02/20/2019    The following portions of the patient's history were reviewed and updated as appropriate: allergies, current medications, past family history, past medical history, past social history, past surgical history and problem list.  Review of Systems A comprehensive review of systems was negative.   Objective:    General appearance: alert, cooperative and appears stated age Neck: no adenopathy, no carotid bruit, no JVD, supple, symmetrical, trachea midline and thyroid not enlarged, symmetric, no tenderness/mass/nodules Lungs: clear to auscultation bilaterally Breasts: normal appearance, no masses or tenderness Heart: regular rate and rhythm, S1, S2 normal, no murmur, click, rub or gallop Abdomen: soft, non-tender; bowel sounds normal; no masses,  no organomegaly Pelvic: cervix normal in appearance, external genitalia normal, no adnexal masses or tenderness, no cervical motion tenderness, rectovaginal septum normal, uterus normal size, shape, and consistency and vagina normal without discharge    Assessment:    Healthy  female exam. HC user     Plan:  Desires continuing with current OCP RTC 1 year or as needed.  Eithel Ryall Aura CampsShambley, CNM   See After Visit Summary for Counseling Recommendations

## 2017-02-25 LAB — CYTOLOGY - PAP

## 2017-07-02 ENCOUNTER — Encounter: Payer: Self-pay | Admitting: Obstetrics and Gynecology

## 2017-07-02 ENCOUNTER — Ambulatory Visit (INDEPENDENT_AMBULATORY_CARE_PROVIDER_SITE_OTHER): Payer: BC Managed Care – PPO | Admitting: Obstetrics and Gynecology

## 2017-07-02 VITALS — BP 129/72 | HR 55 | Wt 152.5 lb

## 2017-07-02 DIAGNOSIS — N644 Mastodynia: Secondary | ICD-10-CM

## 2017-07-02 NOTE — Progress Notes (Signed)
Subjective:     Patient ID: Natalie Walsh, female   DOB: 21-May-1996, 21 y.o.   MRN: [id number]  HPI Reports onset of breast selling and tenderness yesterday, mainly on right side. Very tender under arm pit and at bra line. She is on 2nd week of pills. She does report this happening in the past when she started BCPs. Denies any trauma or increased caffeine intake.   Review of Systems Negative except stated above in HPI    Objective:   Physical Exam A&Ox4 Well groomed female in no distress   Blood pressure 129/72, pulse (!) 55, weight 152 lb 8 oz (69.2 kg). Breasts: breasts appear normal, no suspicious masses, no skin or nipple changes or axillary nodes. Assessment:     Breast tenderness secondary to hormones    Plan:     Reassured of normal findings. To add Vit E 600 IU daily as needed.  RTC prn.  Melody Bloomfield, CNM

## 2017-09-07 ENCOUNTER — Other Ambulatory Visit: Payer: Self-pay | Admitting: Obstetrics and Gynecology

## 2017-09-24 ENCOUNTER — Encounter: Payer: Self-pay | Admitting: Obstetrics and Gynecology

## 2017-09-25 ENCOUNTER — Other Ambulatory Visit: Payer: Self-pay | Admitting: *Deleted

## 2017-09-25 MED ORDER — LO LOESTRIN FE 1 MG-10 MCG / 10 MCG PO TABS: 1.0000 | ORAL_TABLET | Freq: Every day | ORAL | 2 refills | Status: DC

## 2017-09-25 MED ORDER — FLUCONAZOLE 150 MG PO TABS
150.0000 mg | ORAL_TABLET | Freq: Once | ORAL | 2 refills | Status: AC
Start: 2017-09-25 — End: 2017-09-25

## 2017-11-17 ENCOUNTER — Encounter: Payer: Self-pay | Admitting: Certified Nurse Midwife

## 2017-11-17 ENCOUNTER — Ambulatory Visit: Payer: BC Managed Care – PPO | Admitting: Certified Nurse Midwife

## 2017-11-17 VITALS — BP 117/65 | HR 54 | Ht 69.0 in | Wt 150.2 lb

## 2017-11-17 DIAGNOSIS — R300 Dysuria: Secondary | ICD-10-CM | POA: Diagnosis not present

## 2017-11-17 DIAGNOSIS — N760 Acute vaginitis: Secondary | ICD-10-CM

## 2017-11-17 LAB — POCT URINALYSIS DIPSTICK
Bilirubin, UA: NEGATIVE
GLUCOSE UA: NEGATIVE
Ketones, UA: NEGATIVE
LEUKOCYTES UA: NEGATIVE
Nitrite, UA: NEGATIVE
Protein, UA: NEGATIVE
RBC UA: NEGATIVE
UROBILINOGEN UA: 0.2 U/dL
pH, UA: 7.5 (ref 5.0–8.0)

## 2017-11-17 MED ORDER — FLUCONAZOLE 150 MG PO TABS: 150.0000 mg | ORAL_TABLET | Freq: Every day | ORAL | 0 refills | Status: AC

## 2017-11-17 NOTE — Progress Notes (Signed)
Pt is here with c/o dysuria - burning.

## 2017-11-17 NOTE — Progress Notes (Signed)
GYN ENCOUNTER NOTE  Subjective:       Natalie Walsh is a 22 y.o. G0P0000 female is here for gynecologic evaluation of the following issues:  1. Painful urination. She states she has had this for 1 wk. She denies urgency and frequency. She denies fever. She is not currently sexually active but has a partner that she "does other stuff with".   She is unsure if it is burning with urination or if it is due to urine causing burning when coming in contract to surrounding area. She denies increased discharge and odor.    Gynecologic History No LMP recorded. Patient is not currently having periods (Reason: Oral contraceptives). Contraception: OCP (estrogen/progesterone) Last Pap: 02/20/16. Results were: normal Last mammogram: n/A.   Obstetric History OB History  Gravida Para Term Preterm AB Living  0 0 0 0 0 0  SAB TAB Ectopic Multiple Live Births  0 0 0 0          Past Medical History:  Diagnosis Date  . Amenorrhea     Past Surgical History:  Procedure Laterality Date  . FOOT SURGERY      Current Outpatient Medications on File Prior to Visit  Medication Sig Dispense Refill  . cholecalciferol (VITAMIN D) 1000 units tablet Take 1,000 Units by mouth daily.    . LO LOESTRIN FE 1 MG-10 MCG / 10 MCG tablet Take 1 tablet by mouth daily. 84 tablet 2   No current facility-administered medications on file prior to visit.     Allergies  Allergen Reactions  . Azithromycin     Other reaction(s): Unknown    Social History   Socioeconomic History  . Marital status: Single    Spouse name: Not on file  . Number of children: Not on file  . Years of education: Not on file  . Highest education level: Not on file  Social Needs  . Financial resource strain: Not on file  . Food insecurity - worry: Not on file  . Food insecurity - inability: Not on file  . Transportation needs - medical: Not on file  . Transportation needs - non-medical: Not on file  Occupational History  . Not on file   Tobacco Use  . Smoking status: Never Smoker  . Smokeless tobacco: Never Used  Substance and Sexual Activity  . Alcohol use: No  . Drug use: No  . Sexual activity: Not Currently  Other Topics Concern  . Not on file  Social History Narrative  . Not on file    Family History  Problem Relation Age of Onset  . Breast cancer Maternal Aunt   . Cancer Maternal Aunt        breast  . Breast cancer Maternal Grandmother   . Cancer Maternal Grandmother        breast    The following portions of the patient's history were reviewed and updated as appropriate: allergies, current medications, past family history, past medical history, past social history, past surgical history and problem list.  Review of Systems Review of Systems - Negative except as mentioned in HPI  Review of Systems - General ROS: negative for - chills, fatigue, fever, hot flashes, malaise or night sweats Hematological and Lymphatic ROS: negative for - bleeding problems or swollen lymph nodes Gastrointestinal ROS: negative for - abdominal pain, blood in stools, change in bowel habits and nausea/vomiting Musculoskeletal ROS: negative for - joint pain, muscle pain or muscular weakness Genito-Urinary ROS: negative for - change in menstrual cycle,  dysmenorrhea, dyspareunia,  genital discharge, genital ulcers, hematuria, incontinence, irregular/heavy menses, nocturia or pelvic pain. Positive:  dysuria,  Objective:   BP 117/65   Pulse (!) 54   Ht 5\' 9"  (1.753 m)   Wt 150 lb 4 oz (68.2 kg)   BMI 22.19 kg/m  CONSTITUTIONAL: Well-developed, well-nourished female in no acute distress.  HENT:  Normocephalic, atraumatic.  NECK: Normal range of motion,  SKIN: Skin is warm and dry. No rash noted. Not diaphoretic. No erythema. No pallor. NEUROLGIC: Alert and oriented to person, place, and time.  PSYCHIATRIC: Normal mood and affect. Normal behavior. Normal judgment and thought content. CARDIOVASCULAR:Not Examined RESPIRATORY:  Not Examined BREASTS: Not Examined ABDOMEN: Soft, non distended; Non tender.  No Organomegaly. PELVIC:  External Genitalia: Normal  BUS: Normal, redness noted   Vagina: Normal, redness, white particulate discharged noted no odor MUSCULOSKELETAL: Normal range of motion. No tenderness.  No cyanosis, clubbing, or edema.   Assessment:   1. Dysuria - POCT urinalysis dipstick  -urine culture -Nuswab; yeast/BV   Plan:   Diflucan ordered with instructions on use. Pt encouraged to take AZO over the counter for symptoms. Will follow up with results of urine culture and Nuswab. Encouraged lubrication for use of toys and cleaning after each used to avoid infections. She verbalizes understanding and agrees to plan. Follow up PRN .   Pattricia Boss Sotiria Keast,CNM

## 2017-11-17 NOTE — Patient Instructions (Signed)
Acute Urinary Retention, Female Urinary retention means you are unable to pee completely or at all (empty your bladder). Follow these instructions at home:  Drink enough fluids to keep your pee (urine) clear or pale yellow.  If you are sent home with a tube that drains the bladder (catheter), there will be a drainage bag attached to it. There are two types of bags. One is big that you can wear at night without having to empty it. One is smaller and needs to be emptied more often.  Keep the drainage bag emptied.  Keep the drainage bag lower than the tube.  Only take medicine as told by your doctor. Contact a doctor if:  You have a low-grade fever.  You have spasms or you are leaking pee when you have spasms. Get help right away if:  You have chills or a fever.  Your catheter stops draining pee.  Your catheter falls out.  You have increased bleeding that does not stop after you have rested and increased the amount of fluids you had been drinking. This information is not intended to replace advice given to you by your health care provider. Make sure you discuss any questions you have with your health care provider. Document Released: 03/17/2008 Document Revised: 03/06/2016 Document Reviewed: 03/10/2013 Elsevier Interactive Patient Education  2017 Elsevier Inc.  

## 2017-11-19 ENCOUNTER — Telehealth: Payer: Self-pay

## 2017-11-19 ENCOUNTER — Telehealth: Payer: Self-pay | Admitting: Certified Nurse Midwife

## 2017-11-19 LAB — URINE CULTURE: ORGANISM ID, BACTERIA: NO GROWTH

## 2017-11-19 NOTE — Telephone Encounter (Signed)
mychart message sent

## 2017-11-19 NOTE — Telephone Encounter (Signed)
The patient called and stated that she would like to speak her provider or a nurse in regards to her visit on 11/17/17 the patient is still experiencing issues/problems. No other information disclosed. Please advise.

## 2017-11-20 ENCOUNTER — Encounter: Payer: Self-pay | Admitting: Certified Nurse Midwife

## 2017-11-21 LAB — NUSWAB BV AND CANDIDA, NAA
CANDIDA GLABRATA, NAA: NEGATIVE
Candida albicans, NAA: POSITIVE — AB

## 2017-11-22 ENCOUNTER — Encounter: Payer: Self-pay | Admitting: Certified Nurse Midwife

## 2018-02-15 ENCOUNTER — Encounter: Payer: Self-pay | Admitting: Obstetrics and Gynecology

## 2018-02-25 ENCOUNTER — Other Ambulatory Visit: Payer: Self-pay | Admitting: Obstetrics and Gynecology

## 2018-02-25 ENCOUNTER — Ambulatory Visit (INDEPENDENT_AMBULATORY_CARE_PROVIDER_SITE_OTHER): Payer: BC Managed Care – PPO | Admitting: Obstetrics and Gynecology

## 2018-02-25 ENCOUNTER — Encounter: Payer: Self-pay | Admitting: Obstetrics and Gynecology

## 2018-02-25 VITALS — BP 115/73 | HR 79 | Ht 68.0 in | Wt 143.9 lb

## 2018-02-25 DIAGNOSIS — Z01419 Encounter for gynecological examination (general) (routine) without abnormal findings: Secondary | ICD-10-CM

## 2018-02-25 NOTE — Patient Instructions (Signed)
Preventive Care 18-39 Years, Female Preventive care refers to lifestyle choices and visits with your health care provider that can promote health and wellness. What does preventive care include?  A yearly physical exam. This is also called an annual well check.  Dental exams once or twice a year.  Routine eye exams. Ask your health care provider how often you should have your eyes checked.  Personal lifestyle choices, including: ? Daily care of your teeth and gums. ? Regular physical activity. ? Eating a healthy diet. ? Avoiding tobacco and drug use. ? Limiting alcohol use. ? Practicing safe sex. ? Taking vitamin and mineral supplements as recommended by your health care provider. What happens during an annual well check? The services and screenings done by your health care provider during your annual well check will depend on your age, overall health, lifestyle risk factors, and family history of disease. Counseling Your health care provider may ask you questions about your:  Alcohol use.  Tobacco use.  Drug use.  Emotional well-being.  Home and relationship well-being.  Sexual activity.  Eating habits.  Work and work Statistician.  Method of birth control.  Menstrual cycle.  Pregnancy history.  Screening You may have the following tests or measurements:  Height, weight, and BMI.  Diabetes screening. This is done by checking your blood sugar (glucose) after you have not eaten for a while (fasting).  Blood pressure.  Lipid and cholesterol levels. These may be checked every 5 years starting at age 61.  Skin check.  Hepatitis C blood test.  Hepatitis B blood test.  Sexually transmitted disease (STD) testing.  BRCA-related cancer screening. This may be done if you have a family history of breast, ovarian, tubal, or peritoneal cancers.  Pelvic exam and Pap test. This may be done every 3 years starting at age 22. Starting at age 73, this may be done  every 5 years if you have a Pap test in combination with an HPV test.  Discuss your test results, treatment options, and if necessary, the need for more tests with your health care provider. Vaccines Your health care provider may recommend certain vaccines, such as:  Influenza vaccine. This is recommended every year.  Tetanus, diphtheria, and acellular pertussis (Tdap, Td) vaccine. You may need a Td booster every 10 years.  Varicella vaccine. You may need this if you have not been vaccinated.  HPV vaccine. If you are 58 or younger, you may need three doses over 6 months.  Measles, mumps, and rubella (MMR) vaccine. You may need at least one dose of MMR. You may also need a second dose.  Pneumococcal 13-valent conjugate (PCV13) vaccine. You may need this if you have certain conditions and were not previously vaccinated.  Pneumococcal polysaccharide (PPSV23) vaccine. You may need one or two doses if you smoke cigarettes or if you have certain conditions.  Meningococcal vaccine. One dose is recommended if you are age 62-21 years and a first-year college student living in a residence hall, or if you have one of several medical conditions. You may also need additional booster doses.  Hepatitis A vaccine. You may need this if you have certain conditions or if you travel or work in places where you may be exposed to hepatitis A.  Hepatitis B vaccine. You may need this if you have certain conditions or if you travel or work in places where you may be exposed to hepatitis B.  Haemophilus influenzae type b (Hib) vaccine. You may need this  if you have certain risk factors.  Talk to your health care provider about which screenings and vaccines you need and how often you need them. This information is not intended to replace advice given to you by your health care provider. Make sure you discuss any questions you have with your health care provider. Document Released: 11/25/2001 Document Revised:  06/18/2016 Document Reviewed: 07/31/2015 Elsevier Interactive Patient Education  2018 Elsevier Inc.  

## 2018-02-25 NOTE — Progress Notes (Signed)
  Subjective:     Bernise Sylvain is a single white 22 y.o. female and is here for a comprehensive physical exam. Is sexually active, PT chiropractic assistant.The patient reports no problems.  Social History   Socioeconomic History  . Marital status: Single    Spouse name: Not on file  . Number of children: Not on file  . Years of education: Not on file  . Highest education level: Not on file  Occupational History  . Not on file  Social Needs  . Financial resource strain: Not on file  . Food insecurity:    Worry: Not on file    Inability: Not on file  . Transportation needs:    Medical: Not on file    Non-medical: Not on file  Tobacco Use  . Smoking status: Never Smoker  . Smokeless tobacco: Never Used  Substance and Sexual Activity  . Alcohol use: No  . Drug use: No  . Sexual activity: Not Currently  Lifestyle  . Physical activity:    Days per week: Not on file    Minutes per session: Not on file  . Stress: Not on file  Relationships  . Social connections:    Talks on phone: Not on file    Gets together: Not on file    Attends religious service: Not on file    Active member of club or organization: Not on file    Attends meetings of clubs or organizations: Not on file    Relationship status: Not on file  . Intimate partner violence:    Fear of current or ex partner: Not on file    Emotionally abused: Not on file    Physically abused: Not on file    Forced sexual activity: Not on file  Other Topics Concern  . Not on file  Social History Narrative  . Not on file   Health Maintenance  Topic Date Due  . HIV Screening  11/20/2009  . TETANUS/TDAP  11/20/2013  . INFLUENZA VACCINE  05/13/2018  . PAP SMEAR  02/25/2020    The following portions of the patient's history were reviewed and updated as appropriate: allergies, current medications, past family history, past medical history, past social history, past surgical history and problem list.  Review of  Systems Pertinent items noted in HPI and remainder of comprehensive ROS otherwise negative.   Objective:    General appearance: alert, cooperative and appears stated age Neck: no adenopathy, no carotid bruit, no JVD, supple, symmetrical, trachea midline and thyroid not enlarged, symmetric, no tenderness/mass/nodules Lungs: clear to auscultation bilaterally Breasts: normal appearance, no masses or tenderness Heart: regular rate and rhythm, S1, S2 normal, no murmur, click, rub or gallop Abdomen: soft, non-tender; bowel sounds normal; no masses,  no organomegaly Pelvic: cervix normal in appearance, external genitalia normal, no adnexal masses or tenderness, no cervical motion tenderness, rectovaginal septum normal, uterus normal size, shape, and consistency and vagina normal without discharge    Assessment:    Healthy female exam. OCP user.     Plan:  RTC 1 year or as needed.  Melody Shambley,CNM   See After Visit Summary for Counseling Recommendations

## 2018-03-01 LAB — CYTOLOGY - PAP

## 2018-04-05 ENCOUNTER — Encounter: Payer: Self-pay | Admitting: Obstetrics and Gynecology

## 2018-04-06 ENCOUNTER — Ambulatory Visit: Payer: BC Managed Care – PPO | Admitting: Certified Nurse Midwife

## 2018-04-06 VITALS — BP 122/68 | HR 55 | Ht 68.0 in | Wt 145.2 lb

## 2018-04-06 DIAGNOSIS — R5383 Other fatigue: Secondary | ICD-10-CM

## 2018-04-06 NOTE — Progress Notes (Signed)
Pt is here with c/o fatigue, racing heart and lightheadedness. Also her right breast randomly swells and is painful.

## 2018-04-06 NOTE — Patient Instructions (Signed)
Fatigue Fatigue is feeling tired all of the time, a lack of energy, or a lack of motivation. Occasional or mild fatigue is often a normal response to activity or life in general. However, long-lasting (chronic) or extreme fatigue may indicate an underlying medical condition. Follow these instructions at home: Watch your fatigue for any changes. The following actions may help to lessen any discomfort you are feeling: Talk to your health care provider about how much sleep you need each night. Try to get the required amount every night. Take medicines only as directed by your health care provider. Eat a healthy and nutritious diet. Ask your health care provider if you need help changing your diet. Drink enough fluid to keep your urine clear or pale yellow. Practice ways of relaxing, such as yoga, meditation, massage therapy, or acupuncture. Exercise regularly. Change situations that cause you stress. Try to keep your work and personal routine reasonable. Do not abuse illegal drugs. Limit alcohol intake to no more than 1 drink per day for nonpregnant women and 2 drinks per day for men. One drink equals 12 ounces of beer, 5 ounces of wine, or 1 ounces of hard liquor. Take a multivitamin, if directed by your health care provider.  Contact a health care provider if: Your fatigue does not get better. You have a fever. You have unintentional weight loss or gain. You have headaches. You have difficulty: Falling asleep. Sleeping throughout the night. You feel angry, guilty, anxious, or sad. You are unable to have a bowel movement (constipation). You skin is dry. Your legs or another part of your body is swollen. Get help right away if: You feel confused. Your vision is blurry. You feel faint or pass out. You have a severe headache. You have severe abdominal, pelvic, or back pain. You have chest pain, shortness of breath, or an irregular or fast heartbeat. You are unable to urinate or you  urinate less than normal. You develop abnormal bleeding, such as bleeding from the rectum, vagina, nose, lungs, or nipples. You vomit blood. You have thoughts about harming yourself or committing suicide. You are worried that you might harm someone else. This information is not intended to replace advice given to you by your health care provider. Make sure you discuss any questions you have with your health care provider. Document Released: 07/27/2007 Document Revised: 03/06/2016 Document Reviewed: 01/31/2014 Elsevier Interactive Patient Education  2018 [employer]. Breast Tenderness Breast tenderness is a common problem for women of all ages. Breast tenderness may cause mild discomfort to severe pain. The pain usually comes and goes in association with your menstrual cycle, but it can be constant. Breast tenderness has many possible causes, including hormone changes and some medicines. Your health care provider may order tests, such as a mammogram or an ultrasound, to check for any unusual findings. Having breast tenderness usually does not mean that you have breast cancer. Follow these instructions at home: Sometimes, reassurance that you do not have breast cancer is all that is needed. In general, follow these home care instructions: Managing pain and discomfort  If directed, apply ice to the area: ? Put ice in a plastic bag. ? Place a towel between your skin and the bag. ? Leave the ice on for 20 minutes, 2-3 times a day.  Make sure you are wearing a supportive bra, especially during exercise. You may also want to wear a supportive bra while sleeping if your breasts are very tender. Medicines  Take over-the-counter and prescription  medicines only as told by your health care provider. If the cause of your pain is infection, you may be prescribed an antibiotic medicine.  If you were prescribed an antibiotic, take it as told by your health care provider. Do not stop taking the antibiotic  even if you start to feel better. General instructions  Your health care provider may recommend that you reduce the amount of fat in your diet. You can do this by: ? Limiting fried foods. ? Cooking foods using methods, such as baking, boiling, grilling, and broiling.  Decrease the amount of caffeine in your diet. You can do this by drinking more water and choosing caffeine-free options.  Keep a log of the days and times when your breasts are most tender.  Ask your health care provider how to do breast exams at home. This will help you notice if you have an unusual growth or lump. Contact a health care provider if:  Any part of your breast is hard, red, and hot to the touch. This may be a sign of infection.  You are not breastfeeding and you have fluid, especially blood or pus, coming out of your nipples.  You have a fever.  You have a new or painful lump in your breast that remains after your menstrual period ends.  Your pain does not improve or it gets worse.  Your pain is interfering with your daily activities. This information is not intended to replace advice given to you by your health care provider. Make sure you discuss any questions you have with your health care provider. Document Released: 09/11/2008 Document Revised: 06/27/2016 Document Reviewed: 06/27/2016 Elsevier Interactive Patient Education  [employer]2018 Elsevier Inc.

## 2018-04-06 NOTE — Progress Notes (Signed)
GYN ENCOUNTER NOTE  Subjective:       Natalie Walsh is a 22 y.o. G0P0000 female is here for gynecologic evaluation of the following issues:  1. Fatuige for the past 2-3 wks. She states that she has been working more lately but feels like she his having difficulty making it through the day.  She also complains breast pain on her right breast. She states she has had this for a while, it is random and she also so experiences some swelling in that breast. She denies nipple.  discharge or any trauma to the breast. She does admit to sleeping on that side .    Gynecologic History No LMP recorded. (Menstrual status: Oral contraceptives). Contraception: OCP (estrogen/progesterone) Last Pap: 02/25/18. Results were: normal Last mammogram: n/a.   Obstetric History OB History  Gravida Para Term Preterm AB Living  0 0 0 0 0 0  SAB TAB Ectopic Multiple Live Births  0 0 0 0      Past Medical History:  Diagnosis Date  . Amenorrhea     Past Surgical History:  Procedure Laterality Date  . FOOT SURGERY      Current Outpatient Medications on File Prior to Visit  Medication Sig Dispense Refill  . cholecalciferol (VITAMIN D) 1000 units tablet Take 1,000 Units by mouth daily.    . LO LOESTRIN FE 1 MG-10 MCG / 10 MCG tablet Take 1 tablet by mouth daily. 84 tablet 2   No current facility-administered medications on file prior to visit.     Allergies  Allergen Reactions  . Azithromycin     Other reaction(s): Unknown    Social History   Socioeconomic History  . Marital status: Single    Spouse name: Not on file  . Number of children: Not on file  . Years of education: Not on file  . Highest education level: Not on file  Occupational History  . Not on file  Social Needs  . Financial resource strain: Not on file  . Food insecurity:    Worry: Not on file    Inability: Not on file  . Transportation needs:    Medical: Not on file    Non-medical: Not on file  Tobacco Use  . Smoking status:  Never Smoker  . Smokeless tobacco: Never Used  Substance and Sexual Activity  . Alcohol use: No  . Drug use: No  . Sexual activity: Not Currently  Lifestyle  . Physical activity:    Days per week: Not on file    Minutes per session: Not on file  . Stress: Not on file  Relationships  . Social connections:    Talks on phone: Not on file    Gets together: Not on file    Attends religious service: Not on file    Active member of club or organization: Not on file    Attends meetings of clubs or organizations: Not on file    Relationship status: Not on file  . Intimate partner violence:    Fear of current or ex partner: Not on file    Emotionally abused: Not on file    Physically abused: Not on file    Forced sexual activity: Not on file  Other Topics Concern  . Not on file  Social History Narrative  . Not on file    Family History  Problem Relation Age of Onset  . Breast cancer Maternal Aunt   . Cancer Maternal Aunt  breast  . Breast cancer Maternal Grandmother   . Cancer Maternal Grandmother        breast    The following portions of the patient's history were reviewed and updated as appropriate: allergies, current medications, past family history, past medical history, past social history, past surgical history and problem list.  Review of Systems Review of Systems - Negative except as mentioned in HPI Review of Systems - General ROS: negative for - chills, fever, hot flashes, malaise or night sweats. Positive for fatigue  Hematological and Lymphatic ROS: negative for - bleeding problems or swollen lymph nodes Gastrointestinal ROS: negative for - abdominal pain, blood in stools, change in bowel habits and nausea/vomiting Musculoskeletal ROS: negative for - joint pain, muscle pain or muscular weakness Genito-Urinary ROS: negative for - change in menstrual cycle, dysmenorrhea, dyspareunia, dysuria, genital discharge, genital ulcers, hematuria, incontinence,  irregular/heavy menses, nocturia or pelvic pain  Objective:   BP 122/68   Pulse (!) 55   Ht 5\' 8"  (1.727 m)   Wt 145 lb 4 oz (65.9 kg)   BMI 22.09 kg/m  CONSTITUTIONAL: Well-developed, well-nourished female in no acute distress.  HENT:  Normocephalic, atraumatic.  NECK: Normal range of motion, supple, no masses.  Normal thyroid.  SKIN: Skin is warm and dry. No rash noted. Not diaphoretic. No erythema. No pallor. NEUROLGIC: Alert and oriented to person, place, and time. PSYCHIATRIC: Normal mood and affect. Normal behavior. Normal judgment and thought content. CARDIOVASCULAR:Not Examined RESPIRATORY: Not Examined BREASTS: normal bilateral breast exam  ABDOMEN: Soft, non distended; Non tender.  No Organomegaly. PELVIC: not indicated MUSCULOSKELETAL: Normal range of motion. No tenderness.  No cyanosis, clubbing, or edema.   Assessment:   Mastalgia Fatigue      Plan:   Labs CBC, ferritin , Vitamin D Encourage good supportive bra, decrease in Caffeine Try not to sleep on her side. Vitamin A,B, E   Will follow up with lab results.  Follow up PRN   Doreene BurkeAnnie Sereena Marando, CNM

## 2018-04-08 ENCOUNTER — Encounter: Payer: Self-pay | Admitting: Obstetrics and Gynecology

## 2018-04-11 LAB — CBC
HEMOGLOBIN: 13.5 g/dL (ref 11.1–15.9)
Hematocrit: 40.7 % (ref 34.0–46.6)
MCH: 31 pg (ref 26.6–33.0)
MCHC: 33.2 g/dL (ref 31.5–35.7)
MCV: 94 fL (ref 79–97)
Platelets: 262 10*3/uL (ref 150–450)
RBC: 4.35 x10E6/uL (ref 3.77–5.28)
RDW: 12.9 % (ref 12.3–15.4)
WBC: 6.7 10*3/uL (ref 3.4–10.8)

## 2018-04-11 LAB — VITAMIN D 1,25 DIHYDROXY
Vitamin D 1, 25 (OH)2 Total: 48 pg/mL
Vitamin D2 1, 25 (OH)2: <10 pg/mL
Vitamin D3 1, 25 (OH)2: 47 pg/mL

## 2018-04-11 LAB — TSH: TSH: 1.09 u[IU]/mL (ref 0.450–4.500)

## 2018-04-11 LAB — FERRITIN: Ferritin: 47 ng/mL (ref 15–150)

## 2018-04-12 ENCOUNTER — Encounter (INDEPENDENT_AMBULATORY_CARE_PROVIDER_SITE_OTHER): Payer: Self-pay

## 2018-04-19 IMAGING — CT CT FOOT*L* W/O CM
1 series · 8 of 14 positions shown, 10 images · non-contrast
Comparison: 01/24/2016

CLINICAL DATA: Suspected dorsal navicular fracture or stress
injury. Bony contusions in the cuboid and lateral cuneiform on MRI.

EXAM:
CT OF THE LEFT FOOT WITHOUT CONTRAST
TECHNIQUE: Multidetector CT imaging of the left foot was performed according to
the standard protocol. Multiplanar CT image reconstructions were
also generated.

[Series 8: cor st · axial · 0.20mm/px · z∈[+20,+102]mm · 8 of 109 slices shown, 10 images]
[im 9/109  soft-tissue]
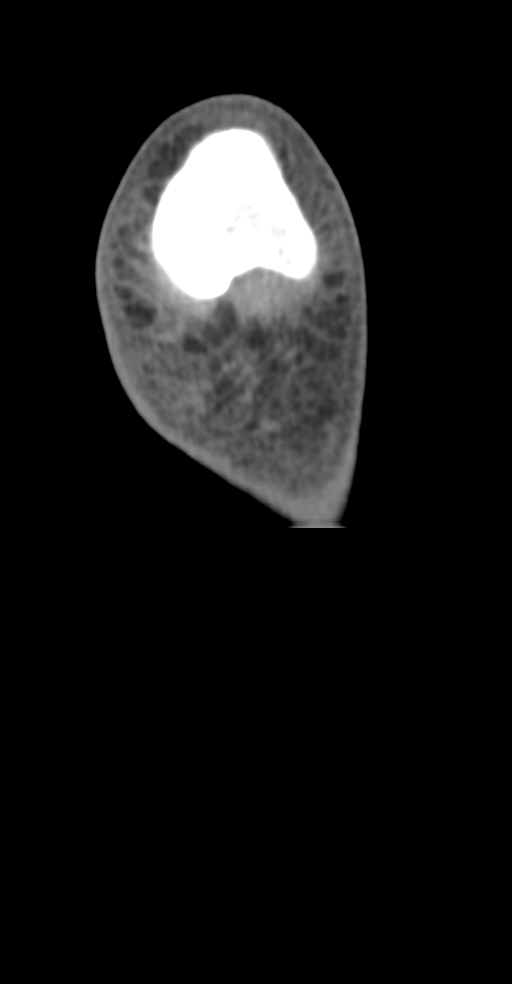
[im 9/109  bone]
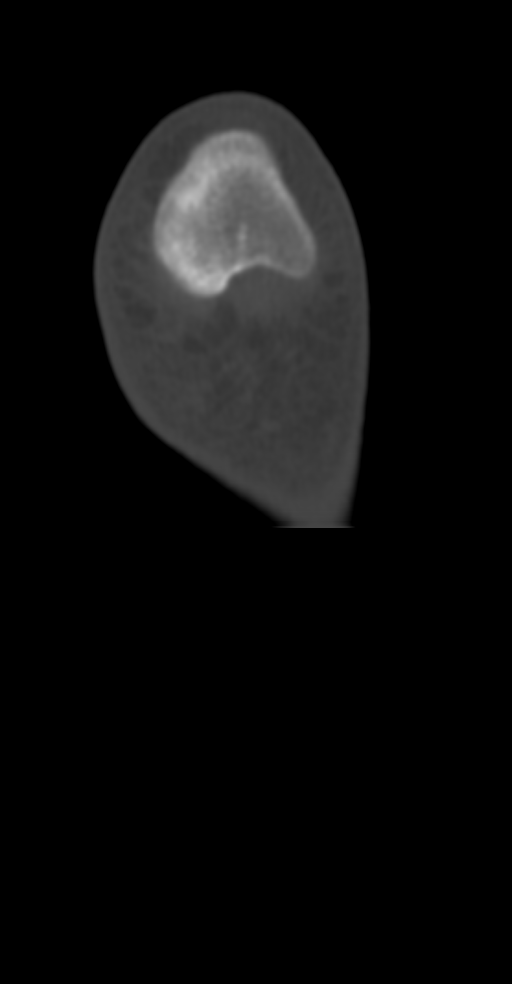
[im 25/109  bone]
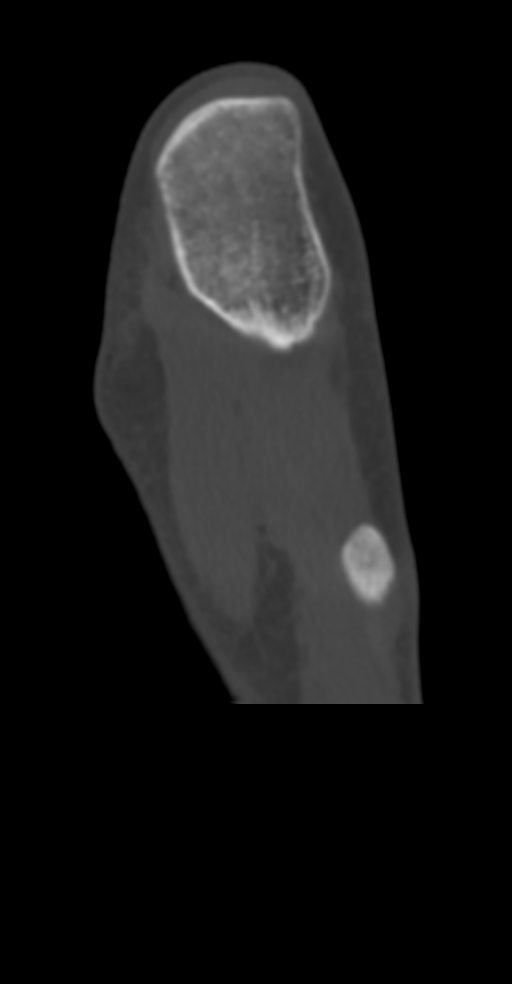
[im 34/109  bone]
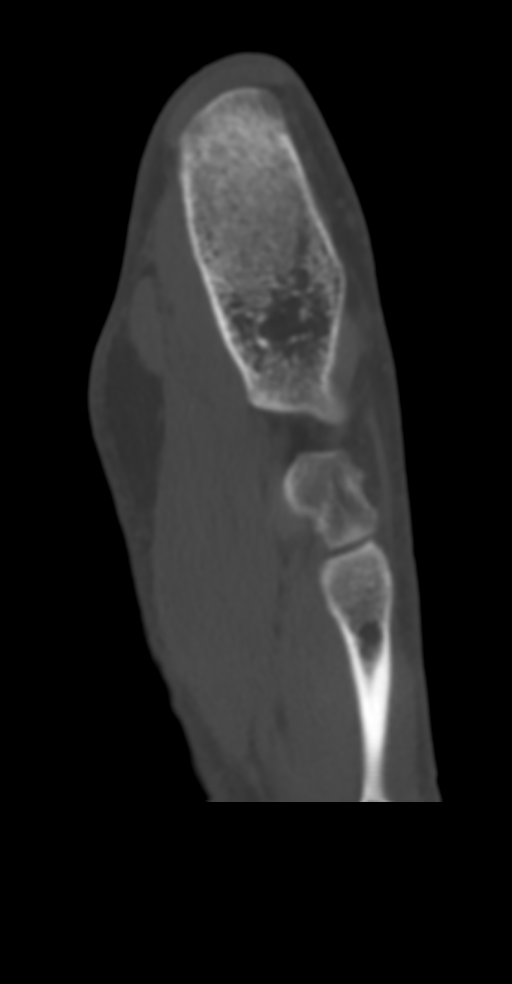
[im 50/109  bone]
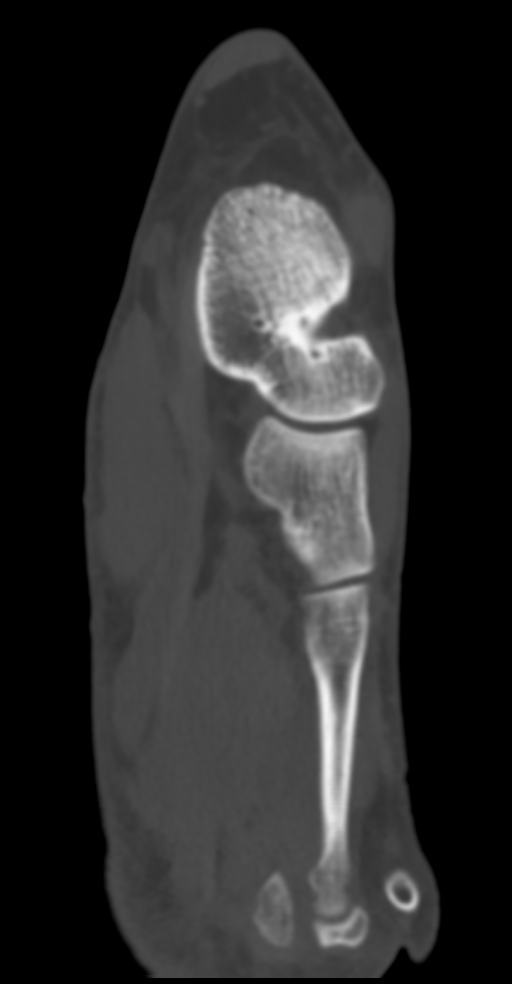
[im 59/109  soft-tissue]
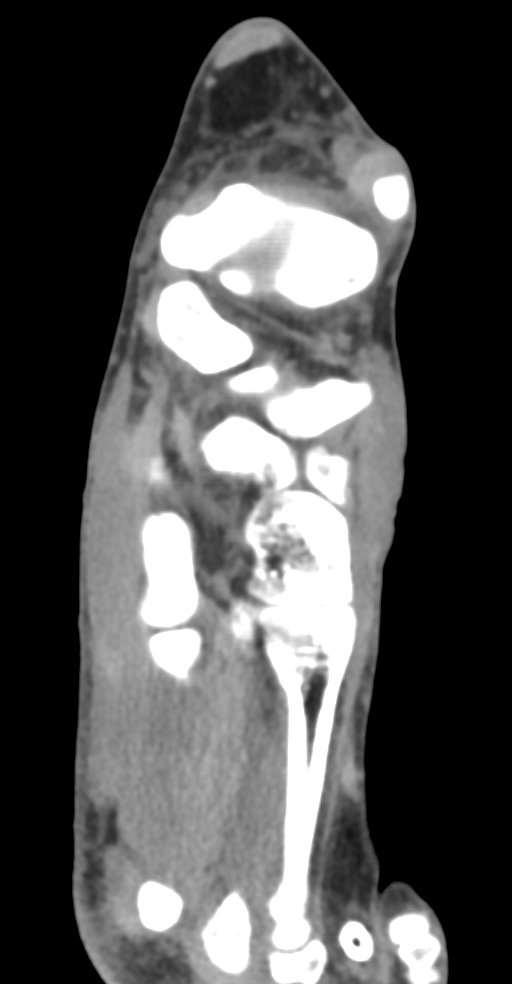
[im 59/109  bone]
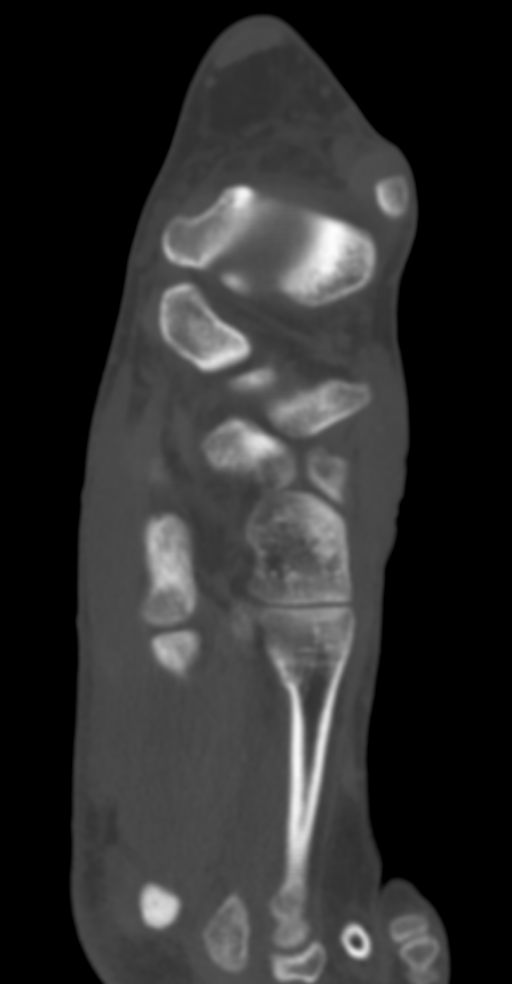
[im 75/109  bone]
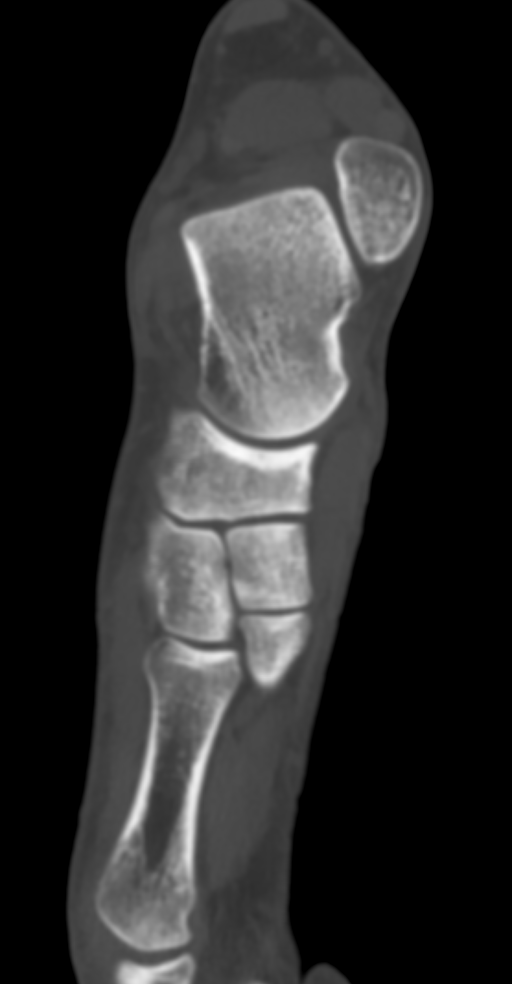
[im 84/109  bone]
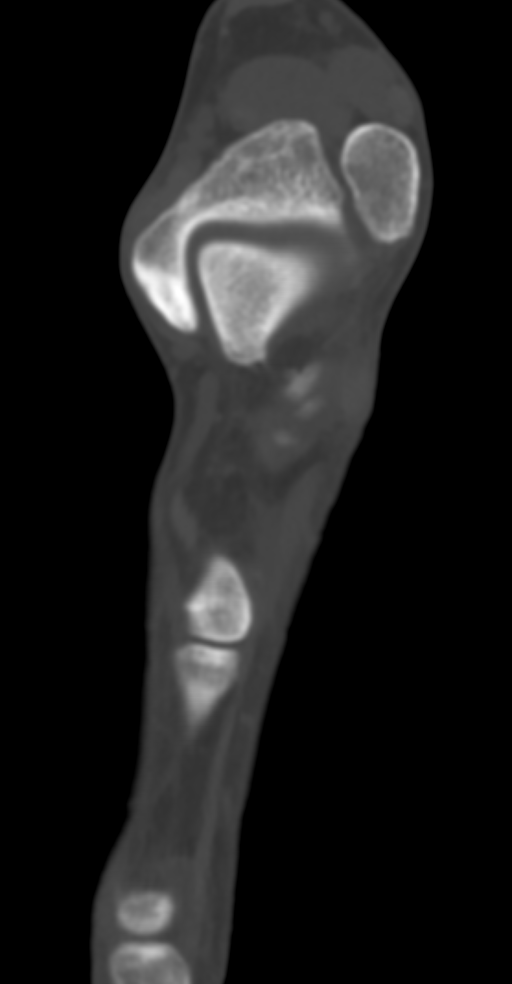
[im 100/109  bone]
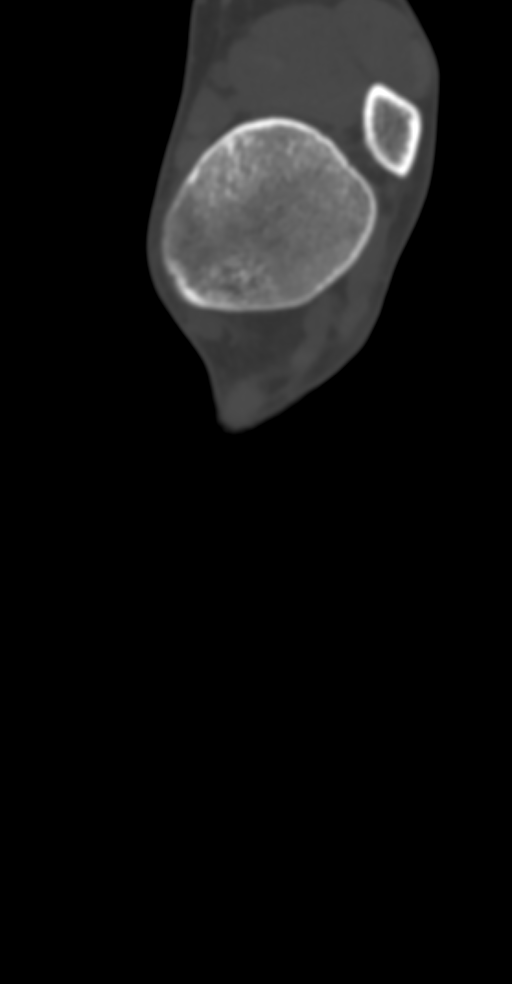

[8 of 14 positions shown; findings below may reference images not displayed]

FINDINGS: Chronically fragmented osteophyte of the proximal dorsal navicular
noted within adjacent area of sclerosis within the bone, and a
bandlike low signal intensity as shown on image 94 series 12,
suspicious for stress fracture. This corresponds with the
abnormality on MRI.

Prior low-grade osseous edema along the distal cuboid, base of the
fourth metatarsal, and distal margin of the lateral cuneiform shown
on MRI do not have a significant CT correlate. There is no
malalignment at the Lisfranc joint.

No findings of tarsal coalition. No additional significant bony
findings.
IMPRESSION: 1. Small localize stress fracture along the proximal the dorsal
margin of the navicular. Adjacent chronically fragmented osteophyte.

## 2018-11-01 ENCOUNTER — Other Ambulatory Visit: Payer: Self-pay | Admitting: *Deleted

## 2018-11-01 MED ORDER — LO LOESTRIN FE 1 MG-10 MCG / 10 MCG PO TABS: 1.0000 | ORAL_TABLET | Freq: Every day | ORAL | 2 refills | Status: DC

## 2019-02-16 ENCOUNTER — Telehealth: Payer: Self-pay

## 2019-02-16 NOTE — Telephone Encounter (Signed)
Coronavirus (COVID-19) Are you at risk?  Are you at risk for the Coronavirus (COVID-19)?  To be considered HIGH RISK for Coronavirus (COVID-19), you have to meet the following criteria:  . Traveled to [place], [place], [place], [place] or [place]; or in the [place] to [place], [place], [place], or [place]; and have fever, cough, and shortness of breath within the last 2 weeks of travel OR . Been in close contact with a person diagnosed with COVID-19 within the last 2 weeks and have fever, cough, and shortness of breath . IF YOU DO NOT MEET THESE CRITERIA, YOU ARE CONSIDERED LOW RISK FOR COVID-19.  What to do if you are HIGH RISK for COVID-19?  . If you are having a medical emergency, call 911. . Seek medical care right away. Before you go to a doctor's office, urgent care or emergency department, call ahead and tell them about your recent travel, contact with someone diagnosed with COVID-19, and your symptoms. You should receive instructions from your physician's office regarding next steps of care.  . When you arrive at healthcare provider, tell the healthcare staff immediately you have returned from visiting [place], [place], [place], [place] or [place]; or traveled in the [place] to [place], [place], [place], or [place]; in the last two weeks or you have been in close contact with a person diagnosed with COVID-19 in the last 2 weeks.   . Tell the health care staff about your symptoms: fever, cough and shortness of breath. . After you have been seen by a medical provider, you will be either: o Tested for (COVID-19) and discharged home on quarantine except to seek medical care if symptoms worsen, and asked to  - Stay home and avoid contact with others until you get your results (4-5 days)  - Avoid travel on public transportation if possible (such as bus, train, or airplane) or o Sent to the Emergency Department by EMS for evaluation, COVID-19 testing, and possible  admission depending on your condition and test results.  What to do if you are LOW RISK for COVID-19?  Reduce your risk of any infection by using the same precautions used for avoiding the common cold or flu:  . Wash your hands often with soap and warm water for at least 20 seconds.  If soap and water are not readily available, use an alcohol-based hand sanitizer with at least 60% alcohol.  . If coughing or sneezing, cover your mouth and nose by coughing or sneezing into the elbow areas of your shirt or coat, into a tissue or into your sleeve (not your hands). . Avoid shaking hands with others and consider head nods or verbal greetings only. . Avoid touching your eyes, nose, or mouth with unwashed hands.  . Avoid close contact with people who are sick. . Avoid places or events with large numbers of people in one location, like concerts or sporting events. . Carefully consider travel plans you have or are making. . If you are planning any travel outside or inside the [place], visit the CDC's Travelers' Health webpage for the latest health notices. . If you have some symptoms but not all symptoms, continue to monitor at home and seek medical attention if your symptoms worsen. . If you are having a medical emergency, call 911.   ADDITIONAL HEALTHCARE OPTIONS FOR PATIENTS  [facility] / e-Visit: [email or web address]         [facility]: [phone]  [facility]: [phone]                   [facility]: [phone]   Pre-screen negative, DM.   

## 2019-02-17 ENCOUNTER — Other Ambulatory Visit (INDEPENDENT_AMBULATORY_CARE_PROVIDER_SITE_OTHER): Payer: BC Managed Care – PPO

## 2019-02-17 ENCOUNTER — Ambulatory Visit (INDEPENDENT_AMBULATORY_CARE_PROVIDER_SITE_OTHER): Payer: BC Managed Care – PPO | Admitting: Obstetrics and Gynecology

## 2019-02-17 ENCOUNTER — Encounter: Payer: Self-pay | Admitting: Obstetrics and Gynecology

## 2019-02-17 ENCOUNTER — Other Ambulatory Visit: Payer: Self-pay | Admitting: Internal Medicine

## 2019-02-17 ENCOUNTER — Other Ambulatory Visit: Payer: Self-pay

## 2019-02-17 ENCOUNTER — Other Ambulatory Visit: Payer: Self-pay | Admitting: Obstetrics and Gynecology

## 2019-02-17 VITALS — BP 128/82 | HR 86 | Ht 69.0 in | Wt 147.3 lb

## 2019-02-17 DIAGNOSIS — N926 Irregular menstruation, unspecified: Secondary | ICD-10-CM | POA: Diagnosis not present

## 2019-02-17 DIAGNOSIS — M545 Low back pain, unspecified: Secondary | ICD-10-CM

## 2019-02-17 DIAGNOSIS — R102 Pelvic and perineal pain: Secondary | ICD-10-CM | POA: Diagnosis not present

## 2019-02-17 NOTE — Progress Notes (Signed)
  Subjective:     Patient ID: Natalie Walsh, female   DOB: 09/05/1996, 23 y.o.   MRN: [id number]  HPI Here to discuss ultrasound for pelvic pain and low back pain. Please see MyChart messages. Patient states pelvic pain is not a bad, but low back pain and constipation persist. Has seen chiropractor and used TENS unit, heat and ice, and motrin with little relief.  Thought she may have another ovarian cysts. Denies dysuria or vaginal bleeding. Is still on BCP.  Is working out [employer] daily. Denies any recent injury to low back.  Review of Systems  Genitourinary: Positive for pelvic pain.  Musculoskeletal: Positive for back pain.  All other systems reviewed and are negative.      Objective:   Physical Exam A&Ox4 Well groomed female in no distress. Blood pressure 128/82, pulse 86, height 5\' 9"  (1.753 m), weight 147 lb 4.8 oz (66.8 kg).   Pelvic u/s today reveals: Indications:Pelvic Pain Findings:  The uterus is anteverted and measures 7.0 x 2.4 x 3.9 cm. Echo texture is homogenous without evidence of focal masses.   The Endometrium measures 5 mm.  Right Ovary measures 3.2 x 1.8 x 2.7  cm. It is normal in appearance. Left Ovary measures 2.2 x 1.9 x 2.2 cm. It is normal in appearance. Survey of the adnexa demonstrates no adnexal masses. There is no free fluid in the cul de sac.  Low back slightly tender on palpation without obvious deviation.    Assessment:     Low back pain  pelvic pain constipation    Plan:     To continue current comfort measures. Reassured of no gynecological causes for concern. Referred to Dr Antoine Primas for evaluation.   RTC as needed.   Antwyne Pingree,CNM

## 2019-02-21 ENCOUNTER — Other Ambulatory Visit: Payer: Self-pay

## 2019-02-21 ENCOUNTER — Ambulatory Visit: Payer: BC Managed Care – PPO | Admitting: Family Medicine

## 2019-02-21 ENCOUNTER — Ambulatory Visit (INDEPENDENT_AMBULATORY_CARE_PROVIDER_SITE_OTHER)
Admission: RE | Admit: 2019-02-21 | Discharge: 2019-02-21 | Disposition: A | Discharge status: Home / Self Care | Payer: BC Managed Care – PPO | Source: Ambulatory Visit | Attending: Family Medicine | Admitting: Family Medicine

## 2019-02-21 VITALS — BP 104/64 | HR 81 | Temp 98.7°F | Ht 69.0 in | Wt 148.0 lb

## 2019-02-21 DIAGNOSIS — M545 Low back pain, unspecified: Secondary | ICD-10-CM

## 2019-02-21 DIAGNOSIS — M533 Sacrococcygeal disorders, not elsewhere classified: Secondary | ICD-10-CM

## 2019-02-21 NOTE — Progress Notes (Addendum)
Courteny Egler T. Dagen Beevers, MD Primary Care and Sports Medicine [facility] [address] [place], [id number] Phone: [phone]   FAX: [phone]  Apolonio SchneidersLydia Bowlds - 23 y.o. female   MRN [id number]   Date of Birth: 12/17/1995  Visit Date: 02/21/2019   PCP: Jolene ProvostPatel, Kirtida, MD   Referred by: Jolene ProvostPatel, Kirtida, MD  Chief Complaint  Patient presents with   Back Pain    x 2 weeks   Subjective:   Apolonio SchneidersLydia Sanna is a 23 y.o. very pleasant female patient:  She is a young lady who is a runner who is seen today as a referral from Dr. Cherre HugerKeith Wells for ongoing back pain.  She has tried some manipulation without success.  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  In the past she was a Writercompetitive gymnast.  Past 2 1/2 weeks cannot run at all and pain with biking.  Pain with walking and better over the last couple of days when she has restricted her activities.  Did help a lot.  Pain with sitting.  3-4 weeks ago started.  This is mostly in the lowest part of her lumbar spine.  She also has some discomfort at the SI joints, and she has had similar sensations in the past with her training as a hurdler.  She is having some restriction to flex and extend at the waist.  Pain with extension.  She is not having any numbness or tingling.  No saddle anesthesia.  Did some NSAIDS.  Did not help at all.  She also has a TENS unit, and she tried this, but it did not really help at all.  Has had some pain down her L hamstring and at the bottom and on the R and to the front.   2 bulging discks and was gymnast. 2 foot [facility].  No menses in a year, but on OCP's.  Past Medical History, Surgical History, Social History, Family History, Problem List, Medications, and Allergies have been reviewed and updated if relevant.  There are no active problems to display for this patient.   Past Medical History:  Diagnosis Date   Amenorrhea     Past  Surgical History:  Procedure Laterality Date   FOOT SURGERY      Social History   Socioeconomic History   Marital status: Single    Spouse name: Not on file   Number of children: Not on file   Years of education: Not on file   Highest education level: Not on file  Occupational History   Not on file  Social Needs   Financial resource strain: Not on file   Food insecurity:    Worry: Not on file    Inability: Not on file   Transportation needs:    Medical: Not on file    Non-medical: Not on file  Tobacco Use   Smoking status: Never Smoker   Smokeless tobacco: Never Used  Substance and Sexual Activity   Alcohol use: No   Drug use: No   Sexual activity: Yes    Birth control/protection: Pill  Lifestyle   Physical activity:    Days per week: Not on file    Minutes per session: Not on file   Stress: Not on file  Relationships   Social connections:    Talks on phone: Not on file    Gets together: Not on file    Attends religious  service: Not on file    Active member of club or organization: Not on file    Attends meetings of clubs or organizations: Not on file    Relationship status: Not on file   Intimate partner violence:    Fear of current or ex partner: Not on file    Emotionally abused: Not on file    Physically abused: Not on file    Forced sexual activity: Not on file  Other Topics Concern   Not on file  Social History Narrative   Not on file    Family History  Problem Relation Age of Onset   Breast cancer Maternal Aunt    Cancer Maternal Aunt        breast   Breast cancer Maternal Grandmother    Cancer Maternal Grandmother        breast    Allergies  Allergen Reactions   Azithromycin     Other reaction(s): Unknown    Medication list reviewed and updated in full in [facility].  GEN: No fevers, chills. Nontoxic. Primarily MSK c/o today. MSK: Detailed in the HPI GI: tolerating PO intake without difficulty Neuro: No  numbness, parasthesias, or tingling associated. Otherwise the pertinent positives of the ROS are noted above.   Objective:   BP 104/64    Pulse 81    Temp 98.7 F (37.1 C) (Oral)    Ht 5\' 9"  (1.753 m)    Wt 148 lb (67.1 kg)    BMI 21.86 kg/m    GEN: Well-developed,well-nourished,in no acute distress; alert,appropriate and cooperative throughout examination HEENT: Normocephalic and atraumatic without obvious abnormalities. Ears, externally no deformities PULM: Breathing comfortably in no respiratory distress EXT: No clubbing, cyanosis, or edema PSYCH: Normally interactive. Cooperative during the interview. Pleasant. Friendly and conversant. Not anxious or depressed appearing. Normal, full affect.  Range of motion at  the waist: Flexion, extension, lateral bending and rotation: Limitation on flexion, she is able to reach beyond her knees to the midportion of her shin.  At baseline she can put her palms flat on the ground.  Extension is limited based on pain and she is only able to minimally extend at the back.  Lateral rotation and bending are normal.  No echymosis or edema Rises to examination table with mild difficulty Gait: minimally antalgic  Inspection/Deformity: N Paraspinus Tenderness: She has some moderate tenderness and spasm from approximately L4-S1.  Bilaterally.  B Ankle Dorsiflexion (L5,4): 5/5 B Great Toe Dorsiflexion (L5,4): 5/5 Heel Walk (L5): WNL Toe Walk (S1): WNL Rise/Squat (L4): WNL, mild pain  SENSORY B Medial Foot (L4): WNL B Dorsum (L5): WNL B Lateral (S1): WNL Light Touch: WNL Pinprick: WNL  REFLEXES Knee (L4): 2+ Ankle (S1): 2+  B SLR, seated: neg B SLR, supine: R lower back pain only B FABER: neg B Log Roll: neg B Stork: + R >> L B Sciatic Notch: mild ttp on R  HIP EXAM: SIDE: B ROM: Abduction, Flexion, Internal and External range of motion: Full, excellent Pain with terminal IROM and EROM: none GTB: NT Knees: No effusion FABER:  NT REVERSE FABER: mild pain on the R Piriformis: Mild ttp on the R Str: flexion: 5/5 abduction: 5/5 adduction: 5/5 Strength testing non-tender and quite strong  Radiology: Dg Lumbar Spine Complete  Result Date: 02/21/2019 CLINICAL DATA:  Acute right-sided low back pain. EXAM: LUMBAR SPINE - COMPLETE 4+ VIEW COMPARISON:  CT abdomen pelvis dated November 02, 2012. FINDINGS: Five lumbar type vertebral bodies. No  acute fracture or subluxation. Vertebral body heights are preserved. No pars defects. Alignment is normal. Intervertebral disc spaces are maintained. The sacroiliac joints are unremarkable. IMPRESSION: Negative. Electronically Signed   By: Obie Dredge M.D.   On: 02/21/2019 11:44   US Pelvis (transabdominal Only)  Result Date: 02/17/2019 Patient Name: Natalie Walsh DOB: 01-14-1996 MRN: [id number] ULTRASOUND REPORT Location: [facility] Date of Service: 02/17/2019 Indications:Pelvic Pain Findings: The uterus is anteverted and measures 7.0 x 2.4 x 3.9 cm. Echo texture is homogenous without evidence of focal masses. The Endometrium measures 5 mm. Right Ovary measures 3.2 x 1.8 x 2.7  cm. It is normal in appearance. Left Ovary measures 2.2 x 1.9 x 2.2 cm. It is normal in appearance. Survey of the adnexa demonstrates no adnexal masses. There is no free fluid in the cul de sac. Impression: 1. Uterus, ovaries, and endometrium are all WNL. Recommendations: 1.Clinical correlation with the patient's History and Physical Exam. Jenine M. Marciano Sequin    RDMS I have reviewed this study and agree with documented findings. 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  She certainly has some SI joint involvement as well to a lesser extent.  There is no evidence of this on plain film, but that is common particularly in the early stages.  We talked about other imaging studies, but at this point we both discussed and we will treat her clinically, and pursue additional imaging if she is not improved in a month.  At that point, consider SPECT scan vs MRI.  Anatomy reviewed.  I am going to have her take a step back from running and do cardiovascular training in a different way such as swimming or biking.  Limited by pain.  We talked about weightlifting alterations.  I am also going to have her do some iliopsoas stretching as well as piriformis stretching.  She can take Tylenol, or NSAIDs for pain.  She has a TENS unit that she can also use as well.  I appreciate the opportunity to evaluate this very friendly patient. If you have any question regarding her care or prognosis, do not hesitate to ask.   Addendum 03/11/2019: I have spoken with the patient and she has made no progress in regards to pain despite > 6 weeks of conservative management.  I spoke to [facility] radiology at our hospital to confirm most appropriate study in this case.  Obtain an MRI of the lumbar spine without contrast to evaluate for pars interarticularis stress fracture, other potential occult fracture, or disc herniation.  Follow-up: Return in about 1 month (around 03/24/2019).  Orders Placed This Encounter  Procedures   DG Lumbar Spine Complete   MR Lumbar Spine Wo Contrast    Signed,  Gaynor Genco T. Jin Shockley, MD   Outpatient Encounter Medications as of 02/21/2019  Medication Sig   cholecalciferol (VITAMIN D) 1000 units tablet Take 1,000 Units by mouth daily.   LO LOESTRIN FE 1 MG-10 MCG / 10 MCG tablet Take 1 tablet by mouth daily.   No facility-administered encounter medications on file as of 02/21/2019.

## 2019-02-22 NOTE — Progress Notes (Signed)
Office note faxed to Dr. Cherre Huger as instructed by Dr. Patsy Lager.

## 2019-02-25 ENCOUNTER — Ambulatory Visit: Payer: BLUE CROSS/BLUE SHIELD | Admitting: Family Medicine

## 2019-02-28 ENCOUNTER — Telehealth: Payer: BC Managed Care – PPO | Admitting: Physician Assistant

## 2019-02-28 ENCOUNTER — Encounter: Payer: Self-pay | Admitting: Physician Assistant

## 2019-02-28 DIAGNOSIS — J208 Acute bronchitis due to other specified organisms: Secondary | ICD-10-CM

## 2019-02-28 DIAGNOSIS — B9689 Other specified bacterial agents as the cause of diseases classified elsewhere: Secondary | ICD-10-CM | POA: Diagnosis not present

## 2019-02-28 MED ORDER — DOXYCYCLINE HYCLATE 100 MG PO TABS: 100.0000 mg | ORAL_TABLET | Freq: Two times a day (BID) | ORAL | 0 refills | Status: DC

## 2019-02-28 MED ORDER — BENZONATATE 100 MG PO CAPS: 100.0000 mg | ORAL_CAPSULE | Freq: Three times a day (TID) | ORAL | 0 refills | Status: DC | PRN

## 2019-02-28 NOTE — Progress Notes (Signed)
We are sorry that you are not feeling well.  Here is how we plan to help!  Based on your presentation I believe you most likely have A cough due to bacteria.  When patients have a fever and a productive cough with a change in color or increased sputum production, we are concerned about bacterial bronchitis.  If left untreated it can progress to pneumonia.  If your symptoms do not improve with your treatment plan it is important that you contact your provider.   I have prescribed Doxycycline 100 mg twice a day for 7 days     In addition you may use A prescription cough medication called Tessalon Perles 100mg. You may take 1-2 capsules every 8 hours as needed for your cough.    From your responses in the eVisit questionnaire you describe inflammation in the upper respiratory tract which is causing a significant cough.  This is commonly called Bronchitis and has four common causes:    Allergies  Viral Infections  Acid Reflux  Bacterial Infection Allergies, viruses and acid reflux are treated by controlling symptoms or eliminating the cause. An example might be a cough caused by taking certain blood pressure medications. You stop the cough by changing the medication. Another example might be a cough caused by acid reflux. Controlling the reflux helps control the cough.  USE OF BRONCHODILATOR ("RESCUE") INHALERS: There is a risk from using your bronchodilator too frequently.  The risk is that over-reliance on a medication which only relaxes the muscles surrounding the breathing tubes can reduce the effectiveness of medications prescribed to reduce swelling and congestion of the tubes themselves.  Although you feel brief relief from the bronchodilator inhaler, your asthma may actually be worsening with the tubes becoming more swollen and filled with mucus.  This can delay other crucial treatments, such as oral steroid medications. If you need to use a bronchodilator inhaler daily, several times per  day, you should discuss this with your provider.  There are probably better treatments that could be used to keep your asthma under control.     HOME CARE . Only take medications as instructed by your medical team. . Complete the entire course of an antibiotic. . Drink plenty of fluids and get plenty of rest. . Avoid close contacts especially the very young and the elderly . Cover your mouth if you cough or cough into your sleeve. . Always remember to wash your hands . A steam or ultrasonic humidifier can help congestion.   GET HELP RIGHT AWAY IF: . You develop worsening fever. . You become short of breath . You cough up blood. . Your symptoms persist after you have completed your treatment plan MAKE SURE YOU   Understand these instructions.  Will watch your condition.  Will get help right away if you are not doing well or get worse.  Your e-visit answers were reviewed by a board certified advanced clinical practitioner to complete your personal care plan.  Depending on the condition, your plan could have included both over the counter or prescription medications. If there is a problem please reply  once you have received a response from your provider. Your safety is important to us.  If you have drug allergies check your prescription carefully.    You can use MyChart to ask questions about today's visit, request a non-urgent call back, or ask for a work or school excuse for 24 hours related to this e-Visit. If it has been greater than 24 hours   you will need to follow up with your provider, or enter a new e-Visit to address those concerns. You will get an e-mail in the next two days asking about your experience.  I hope that your e-visit has been valuable and will speed your recovery. Thank you for using e-visits.  I have spent 7 min in completion and review of this note- Illa Level [facility]

## 2019-03-03 ENCOUNTER — Encounter: Payer: BC Managed Care – PPO | Admitting: Obstetrics and Gynecology

## 2019-03-10 ENCOUNTER — Encounter: Payer: Self-pay | Admitting: Family Medicine

## 2019-03-11 ENCOUNTER — Encounter: Payer: Self-pay | Admitting: Family Medicine

## 2019-03-11 NOTE — Addendum Note (Signed)
Addended by: Hannah Beat on: 03/11/2019 02:54 PM   Modules accepted: Orders

## 2019-03-15 ENCOUNTER — Telehealth: Payer: Self-pay | Admitting: Family Medicine

## 2019-03-15 NOTE — Telephone Encounter (Signed)
Patient was called earlier about scheduling an MRI.  Patient was told she was scheduling her appointment in [place], but when she called to schedule she was told it was suppose to be in [place].  Please call patient back and let her know if she's suppose to schedule in [place] or [place].  Patient prefers [place], but she'll go anywhere she's told.

## 2019-03-15 NOTE — Telephone Encounter (Signed)
Spoke to the patient and changed the location in Epic to [facility] [address] [employer] because we have already got the Authorization and [employer] doesn't want patient to go to a hospital based facility.

## 2019-03-29 ENCOUNTER — Ambulatory Visit
Admission: RE | Admit: 2019-03-29 | Discharge: 2019-03-29 | Disposition: A | Discharge status: Home / Self Care | Payer: BC Managed Care – PPO | Source: Ambulatory Visit | Attending: Family Medicine | Admitting: Family Medicine

## 2019-03-29 ENCOUNTER — Other Ambulatory Visit: Payer: Self-pay

## 2019-03-29 DIAGNOSIS — M545 Low back pain, unspecified: Secondary | ICD-10-CM

## 2019-04-04 ENCOUNTER — Encounter: Payer: Self-pay | Admitting: Family Medicine

## 2019-04-20 ENCOUNTER — Telehealth: Payer: Self-pay

## 2019-04-20 NOTE — Telephone Encounter (Signed)
Coronavirus (COVID-19) Are you at risk?  Are you at risk for the Coronavirus (COVID-19)?  To be considered HIGH RISK for Coronavirus (COVID-19), you have to meet the following criteria:  . Traveled to [place], [place], [place], [place] or [place]; or in the [place] to [place], [place], [place], or [place]; and have fever, cough, and shortness of breath within the last 2 weeks of travel OR . Been in close contact with a person diagnosed with COVID-19 within the last 2 weeks and have fever, cough, and shortness of breath . IF YOU DO NOT MEET THESE CRITERIA, YOU ARE CONSIDERED LOW RISK FOR COVID-19.  What to do if you are HIGH RISK for COVID-19?  . If you are having a medical emergency, call 911. . Seek medical care right away. Before you go to a doctor's office, urgent care or emergency department, call ahead and tell them about your recent travel, contact with someone diagnosed with COVID-19, and your symptoms. You should receive instructions from your physician's office regarding next steps of care.  . When you arrive at healthcare provider, tell the healthcare staff immediately you have returned from visiting [place], [place], [place], [place] or [place]; or traveled in the [place] to [place], [place], [place], or [place]; in the last two weeks or you have been in close contact with a person diagnosed with COVID-19 in the last 2 weeks.   . Tell the health care staff about your symptoms: fever, cough and shortness of breath. . After you have been seen by a medical provider, you will be either: o Tested for (COVID-19) and discharged home on quarantine except to seek medical care if symptoms worsen, and asked to  - Stay home and avoid contact with others until you get your results (4-5 days)  - Avoid travel on public transportation if possible (such as bus, train, or airplane) or o Sent to the Emergency Department by EMS for evaluation, COVID-19 testing, and possible  admission depending on your condition and test results.  What to do if you are LOW RISK for COVID-19?  Reduce your risk of any infection by using the same precautions used for avoiding the common cold or flu:  . Wash your hands often with soap and warm water for at least 20 seconds.  If soap and water are not readily available, use an alcohol-based hand sanitizer with at least 60% alcohol.  . If coughing or sneezing, cover your mouth and nose by coughing or sneezing into the elbow areas of your shirt or coat, into a tissue or into your sleeve (not your hands). . Avoid shaking hands with others and consider head nods or verbal greetings only. . Avoid touching your eyes, nose, or mouth with unwashed hands.  . Avoid close contact with people who are Nayshawn Mesta. . Avoid places or events with large numbers of people in one location, like concerts or sporting events. . Carefully consider travel plans you have or are making. . If you are planning any travel outside or inside the [place], visit the CDC's Travelers' Health webpage for the latest health notices. . If you have some symptoms but not all symptoms, continue to monitor at home and seek medical attention if your symptoms worsen. . If you are having a medical emergency, call 911.  04/20/19 SCREENING NEG SLS ADDITIONAL HEALTHCARE OPTIONS FOR PATIENTS  [facility] / e-Visit: [email or web address]         [facility]: [phone]    [facility]: [phone]                   [facility]: [phone]  

## 2019-04-21 ENCOUNTER — Encounter: Payer: Self-pay | Admitting: Obstetrics and Gynecology

## 2019-04-21 ENCOUNTER — Ambulatory Visit (INDEPENDENT_AMBULATORY_CARE_PROVIDER_SITE_OTHER): Payer: BC Managed Care – PPO | Admitting: Obstetrics and Gynecology

## 2019-04-21 ENCOUNTER — Other Ambulatory Visit: Payer: Self-pay

## 2019-04-21 VITALS — BP 120/68 | HR 74 | Ht 69.0 in | Wt 142.5 lb

## 2019-04-21 DIAGNOSIS — Z01419 Encounter for gynecological examination (general) (routine) without abnormal findings: Secondary | ICD-10-CM

## 2019-04-21 MED ORDER — LO LOESTRIN FE 1 MG-10 MCG / 10 MCG PO TABS: 1.0000 | ORAL_TABLET | Freq: Every day | ORAL | 4 refills | Status: DC

## 2019-04-21 NOTE — Progress Notes (Signed)
Subjective:     Natalie Walsh is a single white 23 y.o. female and is here for a comprehensive physical exam. Is sexually active with single female partner. 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 The patient reports no problems.  Pt well groomed, in no distress. 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 Reports R breast continues to be sore and then gets worse with cyclical menstruation. Family hx of breast cancer. Denies, nipple discharge, lumps, bumps, or skin changes B/L breasts. Pt reports no other concerns. Denis tobacco, alcohol, or illicit drug use. Is happy with current OCP regimen.   Social History   Socioeconomic History  . Marital status: Single    Spouse name: Not on file  . Number of children: Not on file  . Years of education: Not on file  . Highest education level: Not on file  Occupational History  . Not on file  Social Needs  . Financial resource strain: Not on file  . Food insecurity    Worry: Not on file    Inability: Not on file  . Transportation needs    Medical: Not on file    Non-medical: Not on file  Tobacco Use  . Smoking status: Never Smoker  . Smokeless tobacco: Never Used  Substance and Sexual Activity  . Alcohol use: No  . Drug use: No  . Sexual activity: Yes    Birth control/protection: Pill  Lifestyle  . Physical activity    Days per week: Not on file    Minutes per session: Not on file  . Stress: Not on file  Relationships  . Social Herbalist on phone: Not on file    Gets together: Not on file    Attends religious service: Not on file    Active member of club or organization: Not on file    Attends meetings of clubs or organizations: Not on file    Relationship status: Not on file  . Intimate partner violence    Fear of current or ex partner: Not on file    Emotionally abused: Not on file    Physically abused: Not on file    Forced sexual activity: Not on file  Other Topics Concern  . Not on file  Social History  Narrative  . Not on file   Health Maintenance  Topic Date Due  . HIV Screening  11/20/2009  . INFLUENZA VACCINE  05/14/2019  . PAP-Cervical Cytology Screening  02/25/2021  . PAP SMEAR-Modifier  02/25/2021  . TETANUS/TDAP  03/01/2023    The following portions of the patient's history were reviewed and updated as appropriate: allergies, current medications, past family history, past medical history, past social history, past surgical history and problem list.  Review of Systems Pertinent items are noted in HPI.   Objective:    BP 120/68   Pulse 74   Ht 5\' 9"  (1.753 m)   Wt 142 lb 8 oz (64.6 kg)   BMI 21.04 kg/m  General appearance: alert, cooperative and appears stated age Neck: no adenopathy, no carotid bruit, no JVD, supple, symmetrical, trachea midline and thyroid not enlarged, symmetric, no tenderness/mass/nodules Lungs: clear to auscultation bilaterally Breasts: normal appearance, no masses or tenderness Heart: regular rate and rhythm, S1, S2 normal, no murmur, click, rub or gallop Abdomen: soft, non-tender; bowel sounds normal; no masses,  no organomegaly Pelvic: cervix normal in appearance, external genitalia normal, no adnexal masses or tenderness, no cervical motion tenderness, rectovaginal septum normal, uterus  normal size, shape, and consistency and vagina normal without discharge    Assessment:    Healthy female exam.     Plan:  FU in 7118yr for AE or sooner if needed Lo Loestrin FE refilled    Maisie FusBethann Jumper, SNM  [place], CNM See After Visit Summary for Counseling Recommendations

## 2019-04-21 NOTE — Patient Instructions (Signed)
 Preventive Care 21-24 Years Old, Female Preventive care refers to visits with your health care provider and lifestyle choices that can promote health and wellness. This includes:  A yearly physical exam. This may also be called an annual well check.  Regular dental visits and eye exams.  Immunizations.  Screening for certain conditions.  Healthy lifestyle choices, such as eating a healthy diet, getting regular exercise, not using drugs or products that contain nicotine and tobacco, and limiting alcohol use. What can I expect for my preventive care visit? Physical exam Your health care provider will check your:  Height and weight. This may be used to calculate body mass index (BMI), which tells if you are at a healthy weight.  Heart rate and blood pressure.  Skin for abnormal spots. Counseling Your health care provider may ask you questions about your:  Alcohol, tobacco, and drug use.  Emotional well-being.  Home and relationship well-being.  Sexual activity.  Eating habits.  Work and work environment.  Method of birth control.  Menstrual cycle.  Pregnancy history. What immunizations do I need?  Influenza (flu) vaccine  This is recommended every year. Tetanus, diphtheria, and pertussis (Tdap) vaccine  You may need a Td booster every 10 years. Varicella (chickenpox) vaccine  You may need this if you have not been vaccinated. Human papillomavirus (HPV) vaccine  If recommended by your health care provider, you may need three doses over 6 months. Measles, mumps, and rubella (MMR) vaccine  You may need at least one dose of MMR. You may also need a second dose. Meningococcal conjugate (MenACWY) vaccine  One dose is recommended if you are age 19-21 years and a first-year college student living in a residence hall, or if you have one of several medical conditions. You may also need additional booster doses. Pneumococcal conjugate (PCV13) vaccine  You may need  this if you have certain conditions and were not previously vaccinated. Pneumococcal polysaccharide (PPSV23) vaccine  You may need one or two doses if you smoke cigarettes or if you have certain conditions. Hepatitis A vaccine  You may need this if you have certain conditions or if you travel or work in places where you may be exposed to hepatitis A. Hepatitis B vaccine  You may need this if you have certain conditions or if you travel or work in places where you may be exposed to hepatitis B. Haemophilus influenzae type b (Hib) vaccine  You may need this if you have certain conditions. You may receive vaccines as individual doses or as more than one vaccine together in one shot (combination vaccines). Talk with your health care provider about the risks and benefits of combination vaccines. What tests do I need?  Blood tests  Lipid and cholesterol levels. These may be checked every 5 years starting at age 20.  Hepatitis C test.  Hepatitis B test. Screening  Diabetes screening. This is done by checking your blood sugar (glucose) after you have not eaten for a while (fasting).  Sexually transmitted disease (STD) testing.  BRCA-related cancer screening. This may be done if you have a family history of breast, ovarian, tubal, or peritoneal cancers.  Pelvic exam and Pap test. This may be done every 3 years starting at age 21. Starting at age 30, this may be done every 5 years if you have a Pap test in combination with an HPV test. Talk with your health care provider about your test results, treatment options, and if necessary, the need for more   tests. Follow these instructions at home: Eating and drinking   Eat a diet that includes fresh fruits and vegetables, whole grains, lean protein, and low-fat dairy.  Take vitamin and mineral supplements as recommended by your health care provider.  Do not drink alcohol if: ? Your health care provider tells you not to drink. ? You are  pregnant, may be pregnant, or are planning to become pregnant.  If you drink alcohol: ? Limit how much you have to 0-1 drink a day. ? Be aware of how much alcohol is in your drink. In the [place] one drink equals one 12 oz bottle of beer (355 mL), one 5 oz glass of wine (148 mL), or one 1 oz glass of hard liquor (44 mL). Lifestyle  Take daily care of your teeth and gums.  Stay active. Exercise for at least 30 minutes on 5 or more days each week.  Do not use any products that contain nicotine or tobacco, such as cigarettes, e-cigarettes, and chewing tobacco. If you need help quitting, ask your health care provider.  If you are sexually active, practice safe sex. Use a condom or other form of birth control (contraception) in order to prevent pregnancy and STIs (sexually transmitted infections). If you plan to become pregnant, see your health care provider for a preconception visit. What's next?  Visit your health care provider once a year for a well check visit.  Ask your health care provider how often you should have your eyes and teeth checked.  Stay up to date on all vaccines. This information is not intended to replace advice given to you by your health care provider. Make sure you discuss any questions you have with your health care provider. Document Released: 11/25/2001 Document Revised: 06/10/2018 Document Reviewed: 06/10/2018 Elsevier Patient Education  2020 [employer].

## 2019-11-01 ENCOUNTER — Ambulatory Visit: Payer: BC Managed Care – PPO | Attending: Internal Medicine

## 2019-11-01 DIAGNOSIS — Z20822 Contact with and (suspected) exposure to covid-19: Secondary | ICD-10-CM

## 2019-11-03 LAB — NOVEL CORONAVIRUS, NAA: SARS-CoV-2, NAA: NOT DETECTED

## 2019-11-11 ENCOUNTER — Ambulatory Visit: Payer: BC Managed Care – PPO | Attending: Internal Medicine

## 2019-11-11 DIAGNOSIS — Z20822 Contact with and (suspected) exposure to covid-19: Secondary | ICD-10-CM

## 2019-11-12 LAB — NOVEL CORONAVIRUS, NAA: SARS-CoV-2, NAA: NOT DETECTED

## 2019-11-25 ENCOUNTER — Other Ambulatory Visit: Payer: BC Managed Care – PPO

## 2019-12-21 ENCOUNTER — Ambulatory Visit: Payer: BLUE CROSS/BLUE SHIELD | Admitting: Obstetrics and Gynecology

## 2020-01-04 ENCOUNTER — Other Ambulatory Visit: Payer: Self-pay

## 2020-01-04 ENCOUNTER — Encounter: Payer: Self-pay | Admitting: Family Medicine

## 2020-01-04 ENCOUNTER — Ambulatory Visit (INDEPENDENT_AMBULATORY_CARE_PROVIDER_SITE_OTHER): Payer: BC Managed Care – PPO | Admitting: Family Medicine

## 2020-01-04 VITALS — BP 110/76 | HR 62 | Temp 99.3°F | Ht 69.0 in | Wt 146.5 lb

## 2020-01-04 DIAGNOSIS — M7052 Other bursitis of knee, left knee: Secondary | ICD-10-CM | POA: Diagnosis not present

## 2020-01-04 DIAGNOSIS — M76899 Other specified enthesopathies of unspecified lower limb, excluding foot: Secondary | ICD-10-CM

## 2020-01-04 DIAGNOSIS — M7051 Other bursitis of knee, right knee: Secondary | ICD-10-CM | POA: Diagnosis not present

## 2020-01-04 DIAGNOSIS — S8991XA Unspecified injury of right lower leg, initial encounter: Secondary | ICD-10-CM

## 2020-01-04 MED ORDER — MELOXICAM 15 MG PO TABS: 15.0000 mg | ORAL_TABLET | Freq: Every day | ORAL | 2 refills | Status: DC
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
## 2020-01-04 NOTE — Patient Instructions (Signed)
Diclofenac 1% gel: Put this on 4 times a day

## 2020-01-20 ENCOUNTER — Encounter: Payer: Self-pay | Admitting: Family Medicine

## 2020-01-23 ENCOUNTER — Encounter: Payer: Self-pay | Admitting: Family Medicine

## 2020-01-23 ENCOUNTER — Ambulatory Visit: Payer: BC Managed Care – PPO | Admitting: Family Medicine

## 2020-01-23 ENCOUNTER — Other Ambulatory Visit: Payer: Self-pay

## 2020-01-23 ENCOUNTER — Ambulatory Visit (INDEPENDENT_AMBULATORY_CARE_PROVIDER_SITE_OTHER)
Admission: RE | Admit: 2020-01-23 | Discharge: 2020-01-23 | Disposition: A | Discharge status: Home / Self Care | Payer: BC Managed Care – PPO | Source: Ambulatory Visit | Attending: Family Medicine | Admitting: Family Medicine

## 2020-01-23 VITALS — BP 102/60 | HR 55 | Temp 98.2°F | Ht 69.0 in | Wt 147.0 lb

## 2020-01-23 DIAGNOSIS — M25561 Pain in right knee: Secondary | ICD-10-CM | POA: Diagnosis not present

## 2020-01-23 MED ORDER — PREDNISONE 20 MG PO TABS: ORAL_TABLET | ORAL | 0 refills | Status: DC

## 2020-01-23 MED ORDER — METHYLPREDNISOLONE ACETATE 40 MG/ML IJ SUSP
80.0000 mg | Freq: Once | INTRAMUSCULAR | Status: AC
  Administered 2020-01-23: 80 mg via INTRA_ARTICULAR

## 2020-01-23 NOTE — Patient Instructions (Signed)
Start your prednisone after your race on Sunday

## 2020-01-23 NOTE — Progress Notes (Signed)
Alayzha An T. Jaleal Schliep, MD, [place] at [facility] [place], [address]  Phone: [phone]  FAX: [place] - 24 y.o. female  MRN [id number]  Date of Birth: August 01, 1996  Date: 01/23/2020  PCP: Paulina Fusi, MD  Referral: Paulina Fusi, MD  Chief Complaint  Patient presents with  . Follow-up    Right Knee Pain    This visit occurred during the SARS-CoV-2 public health emergency.  Safety protocols were in place, including screening questions prior to the visit, additional usage of staff PPE, and extensive cleaning of exam room while observing appropriate contact time as indicated for disinfecting solutions.   Subjective:   Natalie Walsh is a 24 y.o. very pleasant female patient with Body mass index is 21.71 kg/m. who presents with the following:  Pleasant young lady who is here in follow-up regarding her right knee.  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  I saw her previously and and she was having some pain while doing hurdles and in the posterior lateral aspect of her knee.  Start her on some meloxicam and topical Voltaren, but that is not helped all that much.  She had a track meet over the weekend and this is flared this up a little bit more.  Review of Systems is noted in the HPI, as appropriate   Objective:   BP 102/60   Pulse (!) 55   Temp 98.2 F (36.8 C) (Temporal)   Ht 5\' 9"  (1.753 m)   Wt 147 lb (66.7 kg)   SpO2 98%   BMI 21.71 kg/m   GEN: No acute distress; alert,appropriate. PULM: Breathing comfortably in no respiratory distress PSYCH: Normally interactive.   Right knee: Full extension with some tenderness on extension.  Flexion to 135 degrees.  Nontender with medial and lateral patellar facet loading.  Nontender at the patellar and quad tendon.  Nontender at the anserine bursa.  She does have some mild lateral joint line  tenderness.  She does also have some posterior tenderness along the posterior aspect of her knee as well as some of the distal biceps femoris.  Negative dial test.  Negative McMurray's test and flexion pinch test.  Stable ACL, PCL, MCL, and LCL.  Radiology: DG Knee 4 Views W/Patella Right  Result Date: 01/23/2020 CLINICAL DATA:  Right knee pain, lead runner. EXAM: RIGHT KNEE - COMPLETE 4+ VIEW COMPARISON:  None. FINDINGS: No joint effusion or degenerative changes. IMPRESSION: Negative. Electronically Signed   By: Lorin Picket M.D.   On: 01/23/2020 14:48     Assessment and Plan:     ICD-10-CM   1. Right knee pain, unspecified chronicity  M25.561 DG Knee 4 Views W/Patella Right    methylPREDNISolone acetate (DEPO-MEDROL) injection 80 mg   Plain x-rays of the knee are entirely unremarkable.  Olympic caliber athlete.  WADS rules were reviewed.  Olympic trials in early June.  Intra-articular injection today.  Relative rest over the next few days with mild aerobic activity for core work only.  After her track meet is complete next Sunday, start her oral prednisone taper.  Aspiration/Injection Procedure Note Natalie Walsh September 08, 1996 Date of procedure: 01/23/2020  Procedure: Large Joint Aspiration / Injection of Knee, R Indications: Pain  Procedure Details Patient verbally consented to procedure. Risks (including potential rare risk of infection), benefits, and alternatives explained. Sterilely prepped with Chloraprep. Ethyl cholride used for  anesthesia. 8 cc Lidocaine 1% mixed with 2 mL Depo-Medrol 40 mg injected using the anteromedial approach without difficulty. No complications with procedure and tolerated well. Patient had decreased pain post-injection. Medication: 2 mL of Depo-Medrol 40 mg, equaling Depo-Medrol 80 mg total  Follow-up: No follow-ups on file.  Meds ordered this encounter  Medications  . predniSONE (DELTASONE) 20 MG tablet    Sig: 2 tabs po daily for 5 days, then 1  tab po daily for 5 days    Dispense:  15 tablet    Refill:  0  . methylPREDNISolone acetate (DEPO-MEDROL) injection 80 mg   There are no discontinued medications. Orders Placed This Encounter  Procedures  . DG Knee 4 Views W/Patella Right    Signed,  Karleen Hampshire T. Jamesyn Moorefield, MD   Outpatient Encounter Medications as of 01/23/2020  Medication Sig  . cholecalciferol (VITAMIN D) 1000 units tablet Take 1,000 Units by mouth daily.  . LO LOESTRIN FE 1 MG-10 MCG / 10 MCG tablet Take 1 tablet by mouth daily.  . meloxicam (MOBIC) 15 MG tablet Take 1 tablet (15 mg total) by mouth daily.  . vitamin E 100 UNIT capsule Take by mouth daily.  . predniSONE (DELTASONE) 20 MG tablet 2 tabs po daily for 5 days, then 1 tab po daily for 5 days  . [EXPIRED] methylPREDNISolone acetate (DEPO-MEDROL) injection 80 mg    No facility-administered encounter medications on file as of 01/23/2020.

## 2020-02-22 ENCOUNTER — Other Ambulatory Visit: Payer: BC Managed Care – PPO

## 2020-02-23 ENCOUNTER — Other Ambulatory Visit: Payer: BC Managed Care – PPO

## 2020-04-30 ENCOUNTER — Encounter: Payer: BC Managed Care – PPO | Admitting: Certified Nurse Midwife

## 2020-06-06 ENCOUNTER — Ambulatory Visit (INDEPENDENT_AMBULATORY_CARE_PROVIDER_SITE_OTHER): Payer: BC Managed Care – PPO | Admitting: Certified Nurse Midwife

## 2020-06-06 ENCOUNTER — Encounter: Payer: Self-pay | Admitting: Certified Nurse Midwife

## 2020-06-06 ENCOUNTER — Other Ambulatory Visit: Payer: Self-pay

## 2020-06-06 VITALS — BP 121/71 | HR 51 | Ht 69.0 in | Wt 145.3 lb

## 2020-06-06 DIAGNOSIS — Z13220 Encounter for screening for lipoid disorders: Secondary | ICD-10-CM | POA: Diagnosis not present

## 2020-06-06 DIAGNOSIS — Z1159 Encounter for screening for other viral diseases: Secondary | ICD-10-CM

## 2020-06-06 DIAGNOSIS — Z136 Encounter for screening for cardiovascular disorders: Secondary | ICD-10-CM

## 2020-06-06 DIAGNOSIS — Z114 Encounter for screening for human immunodeficiency virus [HIV]: Secondary | ICD-10-CM | POA: Diagnosis not present

## 2020-06-06 DIAGNOSIS — Z01419 Encounter for gynecological examination (general) (routine) without abnormal findings: Secondary | ICD-10-CM

## 2020-06-06 MED ORDER — LO LOESTRIN FE 1 MG-10 MCG / 10 MCG PO TABS: 1.0000 | ORAL_TABLET | Freq: Every day | ORAL | 4 refills | Status: DC

## 2020-06-06 NOTE — Patient Instructions (Signed)
Preventive Care 21-25 Years Old, Female Preventive care refers to visits with your health care provider and lifestyle choices that can promote health and wellness. This includes:  A yearly physical exam. This may also be called an annual well check.  Regular dental visits and eye exams.  Immunizations.  Screening for certain conditions.  Healthy lifestyle choices, such as eating a healthy diet, getting regular exercise, not using drugs or products that contain nicotine and tobacco, and limiting alcohol use. What can I expect for my preventive care visit? Physical exam Your health care provider will check your:  Height and weight. This may be used to calculate body mass index (BMI), which tells if you are at a healthy weight.  Heart rate and blood pressure.  Skin for abnormal spots. Counseling Your health care provider may ask you questions about your:  Alcohol, tobacco, and drug use.  Emotional well-being.  Home and relationship well-being.  Sexual activity.  Eating habits.  Work and work environment.  Method of birth control.  Menstrual cycle.  Pregnancy history. What immunizations do I need?  Influenza (flu) vaccine  This is recommended every year. Tetanus, diphtheria, and pertussis (Tdap) vaccine  You may need a Td booster every 10 years. Varicella (chickenpox) vaccine  You may need this if you have not been vaccinated. Human papillomavirus (HPV) vaccine  If recommended by your health care provider, you may need three doses over 6 months. Measles, mumps, and rubella (MMR) vaccine  You may need at least one dose of MMR. You may also need a second dose. Meningococcal conjugate (MenACWY) vaccine  One dose is recommended if you are age 19-21 years and a first-year college student living in a residence hall, or if you have one of several medical conditions. You may also need additional booster doses. Pneumococcal conjugate (PCV13) vaccine  You may need  this if you have certain conditions and were not previously vaccinated. Pneumococcal polysaccharide (PPSV23) vaccine  You may need one or two doses if you smoke cigarettes or if you have certain conditions. Hepatitis A vaccine  You may need this if you have certain conditions or if you travel or work in places where you may be exposed to hepatitis A. Hepatitis B vaccine  You may need this if you have certain conditions or if you travel or work in places where you may be exposed to hepatitis B. Haemophilus influenzae type b (Hib) vaccine  You may need this if you have certain conditions. You may receive vaccines as individual doses or as more than one vaccine together in one shot (combination vaccines). Talk with your health care provider about the risks and benefits of combination vaccines. What tests do I need?  Blood tests  Lipid and cholesterol levels. These may be checked every 5 years starting at age 20.  Hepatitis C test.  Hepatitis B test. Screening  Diabetes screening. This is done by checking your blood sugar (glucose) after you have not eaten for a while (fasting).  Sexually transmitted disease (STD) testing.  BRCA-related cancer screening. This may be done if you have a family history of breast, ovarian, tubal, or peritoneal cancers.  Pelvic exam and Pap test. This may be done every 3 years starting at age 21. Starting at age 30, this may be done every 5 years if you have a Pap test in combination with an HPV test. Talk with your health care provider about your test results, treatment options, and if necessary, the need for more tests.   Follow these instructions at home: Eating and drinking   Eat a diet that includes fresh fruits and vegetables, whole grains, lean protein, and low-fat dairy.  Take vitamin and mineral supplements as recommended by your health care provider.  Do not drink alcohol if: ? Your health care provider tells you not to drink. ? You are  pregnant, may be pregnant, or are planning to become pregnant.  If you drink alcohol: ? Limit how much you have to 0-1 drink a day. ? Be aware of how much alcohol is in your drink. In the [place] one drink equals one 12 oz bottle of beer (355 mL), one 5 oz glass of wine (148 mL), or one 1 oz glass of hard liquor (44 mL). Lifestyle  Take daily care of your teeth and gums.  Stay active. Exercise for at least 30 minutes on 5 or more days each week.  Do not use any products that contain nicotine or tobacco, such as cigarettes, e-cigarettes, and chewing tobacco. If you need help quitting, ask your health care provider.  If you are sexually active, practice safe sex. Use a condom or other form of birth control (contraception) in order to prevent pregnancy and STIs (sexually transmitted infections). If you plan to become pregnant, see your health care provider for a preconception visit. What's next?  Visit your health care provider once a year for a well check visit.  Ask your health care provider how often you should have your eyes and teeth checked.  Stay up to date on all vaccines. This information is not intended to replace advice given to you by your health care provider. Make sure you discuss any questions you have with your health care provider. Document Revised: 06/10/2018 Document Reviewed: 06/10/2018 Elsevier Patient Education  2020 [employer].

## 2020-06-06 NOTE — Progress Notes (Signed)
GYNECOLOGY ANNUAL PREVENTATIVE CARE ENCOUNTER NOTE  History:     Natalie Walsh is a 24 y.o. G0P0000 female here for a routine annual gynecologic exam.  Current complaints: none.   Denies abnormal vaginal bleeding, discharge, pelvic pain, problems with intercourse or other gynecologic concerns.     Social Relationship: female partner Living : alone Work: Engineer, manufacturing at [employer], professional completion track and field  Smoke-no/alcohol-no/druge-no  Gynecologic History No LMP recorded (lmp unknown). (Menstrual status: Oral contraceptives). Contraception: OCP (estrogen/progesterone) Last Pap: 03/28/2018. Results were: normal  Last mammogram: n/a  Obstetric History OB History  Gravida Para Term Preterm AB Living  0 0 0 0 0 0  SAB TAB Ectopic Multiple Live Births  0 0 0 0      Past Medical History:  Diagnosis Date  . Amenorrhea     Past Surgical History:  Procedure Laterality Date  . FOOT SURGERY      Current Outpatient Medications on File Prior to Visit  Medication Sig Dispense Refill  . cholecalciferol (VITAMIN D) 1000 units tablet Take 1,000 Units by mouth daily.    . vitamin E 100 UNIT capsule Take by mouth daily.     No current facility-administered medications on file prior to visit.    Allergies  Allergen Reactions  . Azithromycin     Other reaction(s): Unknown    Social History:  reports that she has never smoked. She has never used smokeless tobacco. She reports that she does not drink alcohol and does not use drugs.  Family History  Problem Relation Age of Onset  . Breast cancer Maternal Aunt   . Cancer Maternal Aunt        breast  . Breast cancer Maternal Grandmother   . Cancer Maternal Grandmother        breast    The following portions of the patient's history were reviewed and updated as appropriate: allergies, current medications, past family history, past medical history, past social history, past surgical history and problem  list.  Review of Systems Pertinent items noted in HPI and remainder of comprehensive ROS otherwise negative.  Physical Exam:  BP 121/71   Pulse (!) 51   Ht 5\' 9"  (1.753 m)   Wt 145 lb 5 oz (65.9 kg)   LMP  (LMP Unknown)   BMI 21.46 kg/m  CONSTITUTIONAL: Well-developed, well-nourished female in no acute distress.  HENT:  Normocephalic, atraumatic, External right and left ear normal. Oropharynx is clear and moist EYES: Conjunctivae and EOM are normal. Pupils are equal, round, and reactive to light. No scleral icterus.  NECK: Normal range of motion, supple, no masses.  Normal thyroid.  SKIN: Skin is warm and dry. No rash noted. Not diaphoretic. No erythema. No pallor. MUSCULOSKELETAL: Normal range of motion. No tenderness.  No cyanosis, clubbing, or edema.  2+ distal pulses. NEUROLOGIC: Alert and oriented to person, place, and time. Normal reflexes, muscle tone coordination.  PSYCHIATRIC: Normal mood and affect. Normal behavior. Normal judgment and thought content. CARDIOVASCULAR: Normal heart rate noted, regular rhythm RESPIRATORY: Clear to auscultation bilaterally. Effort and breath sounds normal, no problems with respiration noted. BREASTS: Symmetric in size. No masses, tenderness, skin changes, nipple drainage, or lymphadenopathy bilaterally. ABDOMEN: Soft, no distention noted.  No tenderness, rebound or guarding.  PELVIC: Normal appearing external genitalia and urethral meatus; normal appearing vaginal mucosa and cervix.  No abnormal discharge noted.  Pap smear not due.  Normal uterine size, no other palpable masses, no uterine or  adnexal tenderness.      Assessment and Plan:    1. Screening for HIV (human immunodeficiency virus) - HIV Antibody (routine testing w rflx)   2. Encounter for hepatitis C screening test for low risk patient - Hepatitis C Antibody  3. Encounter for lipid screening for cardiovascular disease - Lipid Profile  4. Women's annual routine gynecological  examination   Pap not indicated Mammogram n/a Labs: hep c, HIV, lipid profile Refill: OCP Referral: none  Routine preventative health maintenance measures emphasized. Please refer to After Visit Summary for other counseling recommendations.      Doreene Burke, CNM

## 2020-06-07 LAB — LIPID PANEL
Chol/HDL Ratio: 2.5 ratio (ref 0.0–4.4)
Cholesterol, Total: 158 mg/dL (ref 100–199)
HDL: 63 mg/dL (ref >39)
LDL Chol Calc (NIH): 79 mg/dL (ref 0–99)
Triglycerides: 86 mg/dL (ref 0–149)
VLDL Cholesterol Cal: 16 mg/dL (ref 5–40)

## 2020-06-07 LAB — HIV ANTIBODY (ROUTINE TESTING W REFLEX): HIV Screen 4th Generation wRfx: NONREACTIVE

## 2020-06-07 LAB — HEPATITIS C ANTIBODY: Hep C Virus Ab: <0.1 s/co ratio (ref 0.0–0.9)

## 2020-07-21 ENCOUNTER — Telehealth (HOSPITAL_COMMUNITY): Payer: Self-pay | Admitting: Adult Health

## 2020-07-21 NOTE — Telephone Encounter (Signed)
Called and talked to patient about monoclonal antibody therapy.  I reviewed her health, BMI, medical history and she does not meet FDA EUA guidelines for treatment.  Patient understood and noted she has an appointment in [place] to receive therapy later today.  I wished her a speedy recovery.    Lillard Anes, NP

## 2020-10-25 ENCOUNTER — Encounter: Payer: Self-pay | Admitting: Family Medicine

## 2020-10-25 ENCOUNTER — Telehealth: Payer: BC Managed Care – PPO | Admitting: Family

## 2020-10-25 ENCOUNTER — Telehealth (INDEPENDENT_AMBULATORY_CARE_PROVIDER_SITE_OTHER): Payer: Self-pay | Admitting: Family Medicine

## 2020-10-25 VITALS — Ht 69.0 in

## 2020-10-25 DIAGNOSIS — R059 Cough, unspecified: Secondary | ICD-10-CM | POA: Diagnosis not present

## 2020-10-25 DIAGNOSIS — Z20822 Contact with and (suspected) exposure to covid-19: Secondary | ICD-10-CM

## 2020-10-25 DIAGNOSIS — J029 Acute pharyngitis, unspecified: Secondary | ICD-10-CM

## 2020-10-25 MED ORDER — BENZONATATE 100 MG PO CAPS: 100.0000 mg | ORAL_CAPSULE | Freq: Two times a day (BID) | ORAL | 0 refills | Status: DC | PRN

## 2020-10-25 MED ORDER — DOXYCYCLINE HYCLATE 100 MG PO TABS: 100.0000 mg | ORAL_TABLET | Freq: Two times a day (BID) | ORAL | 0 refills | Status: AC

## 2020-10-25 NOTE — Progress Notes (Signed)
      Natalie Flicker T. Cherree Conerly, MD Primary Care and Sports Medicine [facility] at [facility] [address] [place], [address] Phone: [phone]  FAX: [phone]  Natalie Walsh - 25 y.o. female  MRN [id number]  Date of Birth: 12-03-1995  Visit Date: 10/25/2020  PCP: Jolene Provost, MD  Referred by: Jolene Provost, MD  Virtual Visit via Video Note:  I connected with  Natalie Walsh on 10/25/2020 12:20 PM EST by a video enabled telemedicine application and verified that I am speaking with the correct person using two identifiers.   Location patient: home computer, tablet, or smartphone Location provider: work or home office Consent: Verbal consent directly obtained from [name]. Persons participating in the virtual visit: patient, provider  I discussed the limitations of evaluation and management by telemedicine and the availability of in person appointments. The patient expressed understanding and agreed to proceed.  Interactive audio and video telecommunications were attempted between this provider and patient, however failed, due to patient having technical difficulties OR patient did not have access to video capability.  We continued and completed visit with audio only.     Chief Complaint  Patient presents with  . Sore Throat    Looks like [facility] did a e-visit today and they prescribed Doxy and Tessalon Perles  . Nasal Congestion  . Ear Fullness    History of Present Illness:  She has a well-known very healthy patient generally.  She started to have a sore throat on Monday and she did have some nasal discharge and congestion.  Through today her throat has been improving.  She denies any fever, fatigue or chills. Globally she has not been feeling all that bad. She does have some production of mucus as she is blowing her nose.  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 Personal trainer.   No fever. Had a little  reflux on Tuesday.  Had Covid in October  Immunization History  Administered Date(s) Administered  . Meningococcal Conjugate 02/28/2013  . Tdap 02/28/2013     Review of Systems as above: See pertinent positives and pertinent negatives per HPI No acute distress verbally   Observations/Objective/Exam:  An attempt was made to discern vital signs over the phone and per patient if applicable and possible.   General:    Alert, Oriented, appears well and in no acute distress  Pulmonary:     On inspection no signs of respiratory distress.  Psych / Neurological:     Pleasant and cooperative.  Assessment and Plan:    ICD-10-CM   1. Suspected COVID-19 virus infection  Z20.822   2. Sore throat  J02.9    Total encounter time: 10 minutes. This includes total time spent on the day of encounter.  I worried that she has COVID-19 given the constellation of symptoms, lack of vaccine, and exceptionally high rates right now in our community.  Recommended that she get COVID-19 testing, and otherwise to continue [place] classic supportive care for viral syndromes.   Signed,  Elpidio Galea. Hudson Lehmkuhl, MD

## 2020-10-25 NOTE — Progress Notes (Signed)
We are sorry that you are not feeling well.  Here is how we plan to help!  Based on your presentation I believe you most likely have A cough due to a virus.  This is called viral bronchitis and is best treated by rest, plenty of fluids and control of the cough.  You may use Ibuprofen or Tylenol as directed to help your symptoms.     In addition you may use A prescription cough medication called Tessalon Perles 100mg . You may take 1-2 capsules every 8 hours as needed for your cough.  I am also going to go ahead and send in an antibiotic for you but I would like you to try treating the symptoms for the next 24-48 hours. If it is not improved by that time, you can start the antibiotics.   From your responses in the eVisit questionnaire you describe inflammation in the upper respiratory tract which is causing a significant cough.  This is commonly called Bronchitis and has four common causes:    Allergies  Viral Infections  Acid Reflux  Bacterial Infection Allergies, viruses and acid reflux are treated by controlling symptoms or eliminating the cause. An example might be a cough caused by taking certain blood pressure medications. You stop the cough by changing the medication. Another example might be a cough caused by acid reflux. Controlling the reflux helps control the cough.  USE OF BRONCHODILATOR ("RESCUE") INHALERS: There is a risk from using your bronchodilator too frequently.  The risk is that over-reliance on a medication which only relaxes the muscles surrounding the breathing tubes can reduce the effectiveness of medications prescribed to reduce swelling and congestion of the tubes themselves.  Although you feel brief relief from the bronchodilator inhaler, your asthma may actually be worsening with the tubes becoming more swollen and filled with mucus.  This can delay other crucial treatments, such as oral steroid medications. If you need to use a bronchodilator inhaler daily, several  times per day, you should discuss this with your provider.  There are probably better treatments that could be used to keep your asthma under control.     HOME CARE . Only take medications as instructed by your medical team. . Complete the entire course of an antibiotic. . Drink plenty of fluids and get plenty of rest. . Avoid close contacts especially the very young and the elderly . Cover your mouth if you cough or cough into your sleeve. . Always remember to wash your hands . A steam or ultrasonic humidifier can help congestion.   GET HELP RIGHT AWAY IF: . You develop worsening fever. . You become short of breath . You cough up blood. . Your symptoms persist after you have completed your treatment plan MAKE SURE YOU   Understand these instructions.  Will watch your condition.  Will get help right away if you are not doing well or get worse.  Your e-visit answers were reviewed by a board certified advanced clinical practitioner to complete your personal care plan.  Depending on the condition, your plan could have included both over the counter or prescription medications. If there is a problem please reply  once you have received a response from your provider. Your safety is important to .  If you have drug allergies check your prescription carefully.    You can use MyChart to ask questions about today's visit, request a non-urgent call back, or ask for a work or school excuse for 24 hours related to this  e-Visit. If it has been greater than 24 hours you will need to follow up with your provider, or enter a new e-Visit to address those concerns. You will get an e-mail in the next two days asking about your experience.  I hope that your e-visit has been valuable and will speed your recovery. Thank you for using e-visits.  Greater than 5 minutes, yet less than 10 minutes of time have been spent researching, coordinating, and implementing care for this patient.

## 2021-02-13 ENCOUNTER — Other Ambulatory Visit: Payer: Self-pay | Admitting: Certified Nurse Midwife

## 2021-04-24 ENCOUNTER — Telehealth: Payer: BLUE CROSS/BLUE SHIELD | Admitting: Family

## 2021-04-24 ENCOUNTER — Encounter: Payer: Self-pay | Admitting: Family

## 2021-04-24 DIAGNOSIS — J069 Acute upper respiratory infection, unspecified: Secondary | ICD-10-CM

## 2021-04-24 MED ORDER — FLUTICASONE PROPIONATE 50 MCG/ACT NA SUSP: 2.0000 | Freq: Every day | NASAL | 6 refills | Status: AC

## 2021-04-24 NOTE — Patient Instructions (Signed)
Upper Respiratory Infection, Adult An upper respiratory infection (URI) is a common viral infection of the nose, throat, and upper air passages that lead to the lungs. The most common type of URI is the common cold. URIs usually get better on their own, without medical treatment. What are the causes? A URI is caused by a virus. You may catch a virus by: Breathing in droplets from an infected person's cough or sneeze. Touching something that has been exposed to the virus (contaminated) and then touching your mouth, nose, or eyes. What increases the risk? You are more likely to get a URI if: You are very young or very old. It is autumn or winter. You have close contact with others, such as at a daycare, school, or health care facility. You smoke. You have long-term (chronic) heart or lung disease. You have a weakened disease-fighting (immune) system. You have nasal allergies or asthma. You are experiencing a lot of stress. You work in an area that has poor air circulation. You have poor nutrition. What are the signs or symptoms? A URI usually involves some of the following symptoms: Runny or stuffy (congested) nose. Sneezing. Cough. Sore throat. Headache. Fatigue. Fever. Loss of appetite. Pain in your forehead, behind your eyes, and over your cheekbones (sinus pain). Muscle aches. Redness or irritation of the eyes. Pressure in the ears or face. How is this diagnosed? This condition may be diagnosed based on your medical history and symptoms, and a physical exam. Your health care provider may use a cotton swab to take a mucus sample from your nose (nasal swab). This sample can be tested to determine what virus is causing the illness. How is this treated? URIs usually get better on their own within 7-10 days. You can take steps at home to relieve your symptoms. Medicines cannot cure URIs, but your health care provider may recommend certain medicines to help relieve symptoms, such  as: Over-the-counter cold medicines. Cough suppressants. Coughing is a type of defense against infection that helps to clear the respiratory system, so take these medicines only as recommended by your health care provider. Fever-reducing medicines. Follow these instructions at home: Activity Rest as needed. If you have a fever, stay home from work or school until your fever is gone or until your health care provider says you are no longer contagious. Your health care provider may have you wear a face mask to prevent your infection from spreading. Relieving symptoms Gargle with a salt-water mixture 3-4 times a day or as needed. To make a salt-water mixture, completely dissolve -1 tsp of salt in 1 cup of warm water. Use a cool-mist humidifier to add moisture to the air. This can help you breathe more easily. Eating and drinking  Drink enough fluid to keep your urine pale yellow. Eat soups and other clear broths. General instructions  Take over-the-counter and prescription medicines only as told by your health care provider. These include cold medicines, fever reducers, and cough suppressants. Do not use any products that contain nicotine or tobacco, such as cigarettes and e-cigarettes. If you need help quitting, ask your health care provider. Stay away from secondhand smoke. Stay up to date on all immunizations, including the yearly (annual) flu vaccine. Keep all follow-up visits as told by your health care provider. This is important. How to prevent the spread of infection to others  URIs can be passed from person to person (are contagious). To prevent the infection from spreading: Wash your hands often with soap and   water. If soap and water are not available, use hand sanitizer. Avoid touching your mouth, face, eyes, or nose. Cough or sneeze into a tissue or your sleeve or elbow instead of into your hand or into the air. Contact a health care provider if: You are getting worse instead  of better. You have a fever or chills. Your mucus is brown or red. You have yellow or brown discharge coming from your nose. You have pain in your face, especially when you bend forward. You have swollen neck glands. You have pain while swallowing. You have white areas in the back of your throat. Get help right away if: You have shortness of breath that gets worse. You have severe or persistent: Headache. Ear pain. Sinus pain. Chest pain. You have chronic lung disease along with any of the following: Wheezing. Prolonged cough. Coughing up blood. A change in your usual mucus. You have a stiff neck. You have changes in your: Vision. Hearing. Thinking. Mood. Summary An upper respiratory infection (URI) is a common infection of the nose, throat, and upper air passages that lead to the lungs. A URI is caused by a virus. URIs usually get better on their own within 7-10 days. Medicines cannot cure URIs, but your health care provider may recommend certain medicines to help relieve symptoms. This information is not intended to replace advice given to you by your health care provider. Make sure you discuss any questions you have with your health care provider. Document Revised: 06/07/2020 Document Reviewed: 06/07/2020 Elsevier Patient Education  2022 Elsevier Inc.  

## 2021-04-24 NOTE — Progress Notes (Signed)
Virtual Visit Consent   Natalie Walsh, you are scheduled for a virtual visit with a [facility] provider today.     Just as with appointments in the office, your consent must be obtained to participate.  Your consent will be active for this visit and any virtual visit you may have with one of our providers in the next 365 days.     If you have a MyChart account, a copy of this consent can be sent to you electronically.  All virtual visits are billed to your insurance company just like a traditional visit in the office.    As this is a virtual visit, video technology does not allow for your provider to perform a traditional examination.  This may limit your provider's ability to fully assess your condition.  If your provider identifies any concerns that need to be evaluated in person or the need to arrange testing (such as labs, EKG, etc.), we will make arrangements to do so.     Although advances in technology are sophisticated, we cannot ensure that it will always work on either your end or our end.  If the connection with a video visit is poor, the visit may have to be switched to a telephone visit.  With either a video or telephone visit, we are not always able to ensure that we have a secure connection.     I need to obtain your verbal consent now.   Are you willing to proceed with your visit today?    Natalie Walsh has provided verbal consent on 04/24/2021 for a virtual visit (video or telephone).   Jannifer Rodney, FNP   Date: 04/24/2021 7:20 AM   Virtual Visit via Video Note   I, Jannifer Rodney, connected with  Natalie Walsh  ([id number], 11-17-1999) on 04/24/21 at  7:30 AM EDT by a video-enabled telemedicine application and verified that I am speaking with the correct person using two identifiers.  Location: Patient: Virtual Visit Location Patient: Home Provider: Virtual Visit Location Provider: Home   I discussed the limitations of evaluation and management by telemedicine and the availability  of in person appointments. The patient expressed understanding and agreed to proceed.    History of Present Illness: Natalie Walsh is a 25 y.o. who identifies as a female who was assigned female at birth, and is being seen today for sinus issues. She has done two home COVID tests that are negative.   HPI: Sinusitis This is a new problem. The current episode started in the past 7 days. The problem is unchanged. There has been no fever. Her pain is at a severity of 0/10. She is experiencing no pain. Associated symptoms include congestion, coughing, headaches, sinus pressure, a sore throat and swollen glands. Pertinent negatives include no chills, hoarse voice, shortness of breath or sneezing. Treatments tried: tylenol and zyrtec. The treatment provided mild relief.   Problems: There are no problems to display for this patient.   Allergies:  Allergies  Allergen Reactions   Azithromycin     Other reaction(s): Unknown   Medications:  Current Outpatient Medications:    fluticasone (FLONASE) 50 MCG/ACT nasal spray, Place 2 sprays into both nostrils daily., Disp: 16 g, Rfl: 6   cholecalciferol (VITAMIN D) 1000 units tablet, Take 1,000 Units by mouth daily., Disp: , Rfl:    LO LOESTRIN FE 1 MG-10 MCG / 10 MCG tablet, Take 1 tablet by mouth daily., Disp: 84 tablet, Rfl: 4   vitamin E 100 UNIT capsule, Take  by mouth daily., Disp: , Rfl:   Observations/Objective: Patient is well-developed, well-nourished in no acute distress.  Resting comfortably at home.  Head is normocephalic, atraumatic.  No labored breathing. Speech is clear and coherent with logical content.  Patient is alert and oriented at baseline.   Assessment and Plan: 1. Viral URI - fluticasone (FLONASE) 50 MCG/ACT nasal spray; Place 2 sprays into both nostrils daily.  Dispense: 16 g; Refill: 6 - Take meds as prescribed - Use a cool mist humidifier  -Use saline nose sprays frequently -Force fluids -For any cough or congestion  Use  plain Mucinex- regular strength or max strength is fine -For fever or aces or pains- take tylenol or ibuprofen. -Throat lozenges if help    Follow Up Instructions: I discussed the assessment and treatment plan with the patient. The patient was provided an opportunity to ask questions and all were answered. The patient agreed with the plan and demonstrated an understanding of the instructions.  A copy of instructions were sent to the patient via MyChart.  The patient was advised to call back or seek an in-person evaluation if the symptoms worsen or if the condition fails to improve as anticipated.  Time:  I spent 9 minutes with the patient via telehealth technology discussing the above problems/concerns.    Jannifer Rodney, FNP

## 2021-06-04 ENCOUNTER — Ambulatory Visit (INDEPENDENT_AMBULATORY_CARE_PROVIDER_SITE_OTHER): Payer: BLUE CROSS/BLUE SHIELD | Admitting: Physician Assistant

## 2021-06-04 ENCOUNTER — Other Ambulatory Visit (HOSPITAL_COMMUNITY): Payer: BLUE CROSS/BLUE SHIELD

## 2021-06-04 ENCOUNTER — Other Ambulatory Visit: Payer: BLUE CROSS/BLUE SHIELD | Attending: Physician Assistant

## 2021-06-04 ENCOUNTER — Encounter (INDEPENDENT_AMBULATORY_CARE_PROVIDER_SITE_OTHER): Payer: Self-pay | Admitting: Physician Assistant

## 2021-06-04 VITALS — BP 131/74 | HR 58 | Temp 98.5°F | Wt 151.2 lb

## 2021-06-04 DIAGNOSIS — J029 Acute pharyngitis, unspecified: Secondary | ICD-10-CM

## 2021-06-04 DIAGNOSIS — Z20822 Contact with and (suspected) exposure to covid-19: Secondary | ICD-10-CM | POA: Insufficient documentation

## 2021-06-04 LAB — STREPTOCOCCUS PYOGENES, PCR: STREPTOCOCCUS PYOGENES, PCR: NOT DETECTED

## 2021-06-04 LAB — COVID-19 CEPHEID - LAB USE ONLY: SARS-CoV-2: NOT DETECTED

## 2021-06-04 NOTE — Result Encounter Note (Signed)
No COVID seen

## 2021-06-04 NOTE — Progress Notes (Signed)
URGENT CARE, [facility]  [address]  [place] [address]       Name: Scotti Motter MRN:  [id number]   Date: 06/04/2021 Age: 25 y.o.       SUBJECTIVE:   Elaynah [place] is a 25 y.o. female who complains of sore throat, nasal congestion for 1 day, since waking this morning. She denies a history of chills, fevers, myalgias, shortness of breath, chest pain, dizziness, nausea and vomiting and denies a history of asthma. No known sick contacts. Patient states she was running in a heavy downpour yesterday, then spent several hours in the gym in her wet clothes and wondered if this could have contributed to her symptoms.    ROS: no fever, no headaches, no wheezing or shortness of breath, no weakness or difficulty with ambulation, no skin rash or lesion      Social History     Tobacco Use   . Smoking status: Never Smoker   . Smokeless tobacco: Never Used   Substance Use Topics   . Alcohol use: Not Currently   . Drug use: Not Currently     Allergy History as of 06/04/21     AZITHROMYCIN       Noted Status Severity Type Reaction    06/04/21 0909 Sharyn Dross, Glynis Smiles, PA-C 06/04/21 Active Low  Nausea/ Vomiting              Family Medical History:     Problem Relation (Age of Onset)    Diabetes Paternal Grandfather          History reviewed. No pertinent past medical history.    There is no problem list on file for this patient.    LO LOESTRIN FE 1 mg-10 mcg (24)/10 mcg (2) Oral Tablet, Take 1 Tablet by mouth Once a day    No facility-administered medications prior to visit.       OBJECTIVE:  BP 131/74   Pulse 58   Temp 36.9 C (98.5 F) (Tympanic)   Wt 68.6 kg (151 lb 3.2 oz)   SpO2 99%       She appears well, vital signs are as noted.   Ears: normal TM's and external ear canals AU.    Throat and pharynx no erythema, no hypertrophy, no exudates, mmm. +PND.   Neck supple. No adenopathy in the neck. Nose clear. Maxillary sinuses nontender   The chest is clear, without wheezes or rales   Heart: regular rate and  rhythm  Skin: Warm and dry    Rapid strep: negative. Sending pcr.  COVID swab collected.    ASSESSMENT:     ICD-10-CM    1. Sore throat  J02.9 POCT RAPID STREP A     STREPTOCOCCUS PYOGENES, PCR     COVID-19 CEPHEID - LAB USE ONLY       PLAN:  Pt to quarantine until results finalize. Discussed good handwashing, may use tylenol for fever or pain Q4-6hrs.  Symptomatic therapy suggested: push fluids, rest, use acetaminophen, ibuprofen, antihistamine-decongestant of choice, cough suppressant of choice prn and ROV prn if symptoms persist or worsen. Pt verbalized understanding and agrees with the plan of care. Advised pt to call or return to clinic prn if these symptoms worsen or fail to improve as anticipated.    Judeen Hammans, PA-C 06/04/2021 09:20    My collaborating physician today is Dr. Melvyn Neth.

## 2021-06-04 NOTE — Nursing Note (Signed)
06/04/21 0800   Rapid Strep   Time Performed 0841   Rapid Strep Negative   Lot# [id number]   Expiration Date 11/12/22   Internal Control Valid yes   Nurse initials ks

## 2021-06-04 NOTE — Nursing Note (Signed)
BP 131/74   Pulse 58   Temp 36.9 C (98.5 F) (Tympanic)   Wt 68.6 kg (151 lb 3.2 oz)   SpO2 99%     Oren Binet, MA  06/04/2021, 08:35

## 2021-06-04 NOTE — Patient Instructions (Signed)
URGENT CARE, [facility]  [address]  [place] [address]  Phone: [phone]  Fax: [phone]           Open Daily 8:00am - 8:00pm, except Sundays 12pm-8pm         ~ Closed Thanksgiving and Christmas Day     Attending Caregiver: Judeen Hammans, PA-C    Today's orders:   Orders Placed This Encounter   . STREPTOCOCCUS PYOGENES, PCR   . COVID-19 CEPHEID - LAB USE ONLY   . POCT RAPID STREP A        Prescription(s) E-Rx to:  [id number] - [place], [place] - [address]    ________________________________________________________________________  Short Term Disability and Family Medical Leave Act  [facility] does NOT provide assistance with any disability applications.  If you feel your medical condition requires you to be on disability, you will need to follow up with  Your primary care physician or a specialist.  We apologize for any inconvenience.    For Medication Prescribed by [facility]:  As an Urgent Care facility, our clinic does NOT offer prescription refills over the telephone.    If you need more of the medication one of our medical providers prescribed, you will  Either need to be re-evaluated by [place] or see your primary care physician.    ________________________________________________________________________      It is very important that we have a phone number that is the single best way to contact you in the event that we become aware of important clinical information or concerns after your discharge.  If the phone number you provided at registration is NOT this number you should inform staff and registration prior to leaving.      Your treatment and evaluation today was focused on identifying and treating potentially emergent conditions based on your presenting signs, symptoms, and history.  The resulting initial clinical impression and treatment plan is not intended to be definitive or a substitute for a full physical examination and evaluation by your  primary care provider.  If your symptoms persist, worsen, or you develop any new or concerning symptoms, you need to be evaluated.      If you received x-rays during your visit, be aware that the final and formal interpretation of those films by a radiologist may occur after your discharge.  If there is a significant discrepancy identified after your discharge, we will contact you at the telephone number provided at registration.      If you received a pelvic exam, you may have cultures pending for sexually transmitted diseases.  Positive cultures are reported to the [facility] Department of Health as required by state law.  You should be contacted if you cultures are positive.  We will not contact you if they are negative.  You did NOT receive a PAP smear (the screening test for cervical).  This specific test for women is best performed by your gynecologist or primary care provider when indicated.      If you are over 2 year old, we cannot discuss your personal health information with a parent, spouse, family member, or anyone else without your express consent.  This does not include those who have legitimate access to your records and information to assist in your care under the provisions of HIPAA [facility] Portability and Accountability Act) law, or those to whom you have previously given express written consent to do so, such a legal guardian or Power of [place].  You may have received medication that may cause you to feel drowsy and/or light headed for several hours.  You may even experience some amnesia of your stay.  You should avoid operating a motor vehicle or performing any activity requiring complete alertness or coordination until you feel fully awake (approximately 24-48 hours).  Avoid alcoholic beverages.  You may also have a dry mouth for several hours.  This is a normal side effect and will disappear as the effects of the medication wear off.      Instructions discussed with patient upon  discharge by clinical staff with all questions answered.  Please call [facility] [phone] if any further questions.  Go immediately to the emergency department if any concern or worsening symptoms.    Judeen Hammans, PA-C 06/04/2021, 09:12

## 2021-06-04 NOTE — Addendum Note (Signed)
Addended by: Michael Litter on: 06/04/2021 09:21 AM     Modules accepted: Level of Service

## 2021-06-05 NOTE — Result Encounter Note (Signed)
Amanda Harvey     Your strep PCR  test is within normal limits  Follow-up with primary care or at Urgent care if symptoms persistent.   ER if symptoms worsening    Raelene Bott, MD

## 2021-06-11 ENCOUNTER — Encounter: Payer: BC Managed Care – PPO | Admitting: Certified Nurse Midwife

## 2021-06-11 ENCOUNTER — Encounter: Payer: Self-pay | Admitting: Certified Nurse Midwife

## 2021-06-11 ENCOUNTER — Telehealth: Payer: BLUE CROSS/BLUE SHIELD | Admitting: Physician Assistant

## 2021-06-11 DIAGNOSIS — J028 Acute pharyngitis due to other specified organisms: Secondary | ICD-10-CM | POA: Diagnosis not present

## 2021-06-11 DIAGNOSIS — B9689 Other specified bacterial agents as the cause of diseases classified elsewhere: Secondary | ICD-10-CM

## 2021-06-11 MED ORDER — AMOXICILLIN 500 MG PO CAPS: 500.0000 mg | ORAL_CAPSULE | Freq: Two times a day (BID) | ORAL | 0 refills | Status: AC

## 2021-06-11 MED ORDER — LIDOCAINE VISCOUS HCL 2 % MT SOLN: 15.0000 mL | OROMUCOSAL | 0 refills | Status: AC | PRN

## 2021-06-11 NOTE — Patient Instructions (Signed)
Apolonio Schneiders, thank you for joining Natalie Loveless, PA-C for today's virtual visit.  While this provider is not your primary care provider (PCP), if your PCP is located in our provider database this encounter information will be shared with them immediately following your visit.  Consent: (Patient) Natalie Walsh provided verbal consent for this virtual visit at the beginning of the encounter.  Current Medications:  Current Outpatient Medications:    amoxicillin (AMOXIL) 500 MG capsule, Take 1 capsule (500 mg total) by mouth 2 (two) times daily for 10 days., Disp: 20 capsule, Rfl: 0   lidocaine (XYLOCAINE) 2 % solution, Use as directed 15 mLs in the mouth or throat every 4 (four) hours as needed for mouth pain., Disp: 200 mL, Rfl: 0   cholecalciferol (VITAMIN D) 1000 units tablet, Take 1,000 Units by mouth daily., Disp: , Rfl:    fluticasone (FLONASE) 50 MCG/ACT nasal spray, Place 2 sprays into both nostrils daily., Disp: 16 g, Rfl: 6   LO LOESTRIN FE 1 MG-10 MCG / 10 MCG tablet, Take 1 tablet by mouth daily., Disp: 84 tablet, Rfl: 4   vitamin E 100 UNIT capsule, Take by mouth daily., Disp: , Rfl:    Medications ordered in this encounter:  Meds ordered this encounter  Medications   amoxicillin (AMOXIL) 500 MG capsule    Sig: Take 1 capsule (500 mg total) by mouth 2 (two) times daily for 10 days.    Dispense:  20 capsule    Refill:  0    Order Specific Question:   Supervising Provider    Answer:   MILLER, BRIAN [[id number]]   lidocaine (XYLOCAINE) 2 % solution    Sig: Use as directed 15 mLs in the mouth or throat every 4 (four) hours as needed for mouth pain.    Dispense:  200 mL    Refill:  0    Order Specific Question:   Supervising Provider    Answer:   Hyacinth Meeker, BRIAN [[id number]]     *If you need refills on other medications prior to your next appointment, please contact your pharmacy*  Follow-Up: Call back or seek an in-person evaluation if the symptoms worsen or if the condition fails to  improve as anticipated.  Other Instructions Pharyngitis  Pharyngitis is a sore throat (pharynx). This is when there is redness, pain, and swelling in your throat. Most of the time, this condition gets better on its own. In some cases, you may needmedicine. Follow these instructions at home: Take over-the-counter and prescription medicines only as told by your doctor. If you were prescribed an antibiotic medicine, take it as told by your doctor. Do not stop taking the antibiotic even if you start to feel better. Do not give children aspirin. Aspirin has been linked to Reye syndrome. Drink enough water and fluids to keep your pee (urine) clear or pale yellow. Get a lot of rest. Rinse your mouth (gargle) with a salt-water mixture 3-4 times a day or as needed. To make a salt-water mixture, completely dissolve -1 tsp of salt in 1 cup of warm water. Do not swallow this mixture. If your doctor approves, you may use throat lozenges or sprays to soothe your throat. Contact a doctor if: You have large, tender lumps in your neck. You have a rash. You cough up green, yellow-brown, or bloody spit. Get help right away if: You have a stiff neck. You drool or cannot swallow liquids. You cannot drink or take medicines without throwing up. You have  very bad pain that does not go away with medicine. You have problems breathing, and it is not from a stuffy nose. You have new pain and swelling in your knees, ankles, wrists, or elbows. Summary Pharyngitis is a sore throat (pharynx). This is when there is redness, pain, and swelling in your throat. If you were prescribed an antibiotic medicine, take it as told by your doctor. Do not stop taking the antibiotic even if you start to feel better. Most of the time, pharyngitis gets better on its own. Sometimes, you may need medicine. This information is not intended to replace advice given to you by your health care provider. Make sure you discuss any questions  you have with your healthcare provider. Document Revised: 08/30/2020 Document Reviewed: 08/31/2020 Elsevier Patient Education  2022 [employer].    If you have been instructed to have an in-person evaluation today at a local Urgent Care facility, please use the link below. It will take you to a list of all of our available [facility], including address, phone number and hours of operation. Please do not delay care.  [facility]  If you or a family member do not have a primary care provider, use the link below to schedule a visit and establish care. When you choose a [facility] primary care physician or advanced practice provider, you gain a long-term partner in health. Find a Primary Care Provider  Learn more about [facility] in-office and virtual care options:  - Get Care Now

## 2021-06-11 NOTE — Progress Notes (Signed)
Virtual Visit Consent   Natalie Walsh, you are scheduled for a virtual visit with a [facility] provider today.     Just as with appointments in the office, your consent must be obtained to participate.  Your consent will be active for this visit and any virtual visit you may have with one of our providers in the next 365 days.     If you have a MyChart account, a copy of this consent can be sent to you electronically.  All virtual visits are billed to your insurance company just like a traditional visit in the office.    As this is a virtual visit, video technology does not allow for your provider to perform a traditional examination.  This may limit your provider's ability to fully assess your condition.  If your provider identifies any concerns that need to be evaluated in person or the need to arrange testing (such as labs, EKG, etc.), we will make arrangements to do so.     Although advances in technology are sophisticated, we cannot ensure that it will always work on either your end or our end.  If the connection with a video visit is poor, the visit may have to be switched to a telephone visit.  With either a video or telephone visit, we are not always able to ensure that we have a secure connection.     I need to obtain your verbal consent now.   Are you willing to proceed with your visit today?    Breunna Nordmann has provided verbal consent on 06/11/2021 for a virtual visit (video or telephone).   Margaretann Loveless, PA-C   Date: 06/11/2021 6:58 PM   Virtual Visit via Video Note   I, Margaretann Loveless, connected with  Natalie Walsh  ([id number], 10-28-1995) on 06/11/21 at  6:45 PM EDT by a video-enabled telemedicine application and verified that I am speaking with the correct person using two identifiers.  Location: Patient: Virtual Visit Location Patient: Home Provider: Virtual Visit Location Provider: Home Office   I discussed the limitations of evaluation and management by telemedicine  and the availability of in person appointments. The patient expressed understanding and agreed to proceed.    History of Present Illness: Natalie Walsh is a 25 y.o. who identifies as a female who was assigned female at birth, and is being seen today for sore throat.  HPI: Sore Throat  This is a new problem. The current episode started 1 to 4 weeks ago (symptoms started 06/03/21). The problem has been gradually worsening. Sore throat worse side: both switching sides. There has been no fever. Associated symptoms include congestion (mild), ear pain and swollen glands. Pertinent negatives include no coughing, drooling, ear discharge, hoarse voice, plugged ear sensation, neck pain, shortness of breath or trouble swallowing. She has had no exposure to strep or mono. Treatments tried: allergy pill, pepcid AC, tylenol, hot liquids. The treatment provided no relief.   Patient was seen at [facility] on 06/04/21 and had covid testing and strep testing, both negative.  Problems: There are no problems to display for this patient.   Allergies:  Allergies  Allergen Reactions   Azithromycin     Other reaction(s): Unknown   Medications:  Current Outpatient Medications:    amoxicillin (AMOXIL) 500 MG capsule, Take 1 capsule (500 mg total) by mouth 2 (two) times daily for 10 days., Disp: 20 capsule, Rfl: 0   lidocaine (XYLOCAINE) 2 % solution, Use as directed 15 mLs in the  mouth or throat every 4 (four) hours as needed for mouth pain., Disp: 200 mL, Rfl: 0   cholecalciferol (VITAMIN D) 1000 units tablet, Take 1,000 Units by mouth daily., Disp: , Rfl:    fluticasone (FLONASE) 50 MCG/ACT nasal spray, Place 2 sprays into both nostrils daily., Disp: 16 g, Rfl: 6   LO LOESTRIN FE 1 MG-10 MCG / 10 MCG tablet, Take 1 tablet by mouth daily., Disp: 84 tablet, Rfl: 4   vitamin E 100 UNIT capsule, Take by mouth daily., Disp: , Rfl:   Observations/Objective: Patient is well-developed, well-nourished in no acute distress.  Resting  comfortably at home.  Head is normocephalic, atraumatic.  No labored breathing.  Speech is clear and coherent with logical content.  Patient is alert and oriented at baseline.    Assessment and Plan: 1. Acute bacterial pharyngitis - amoxicillin (AMOXIL) 500 MG capsule; Take 1 capsule (500 mg total) by mouth 2 (two) times daily for 10 days.  Dispense: 20 capsule; Refill: 0 - lidocaine (XYLOCAINE) 2 % solution; Use as directed 15 mLs in the mouth or throat every 4 (four) hours as needed for mouth pain.  Dispense: 200 mL; Refill: 0  - Symptoms present over 7 days now with red blisters starting to arise - Will treat as possible bacterial pharyngitis with amoxicillin as above - Viscous lidocaine for throat pain - Push fluids - Seek in person evaluation if symptoms persist or worsen  Follow Up Instructions: I discussed the assessment and treatment plan with the patient. The patient was provided an opportunity to ask questions and all were answered. The patient agreed with the plan and demonstrated an understanding of the instructions.  A copy of instructions were sent to the patient via MyChart.  The patient was advised to call back or seek an in-person evaluation if the symptoms worsen or if the condition fails to improve as anticipated.  Time:  I spent 12 minutes with the patient via telehealth technology discussing the above problems/concerns.    Margaretann Loveless, PA-C

## 2021-06-18 ENCOUNTER — Encounter: Payer: Self-pay | Admitting: Physician Assistant

## 2021-06-18 DIAGNOSIS — B379 Candidiasis, unspecified: Secondary | ICD-10-CM

## 2021-06-18 MED ORDER — FLUCONAZOLE 150 MG PO TABS: 150.0000 mg | ORAL_TABLET | Freq: Once | ORAL | 0 refills | Status: AC

## 2021-07-03 ENCOUNTER — Other Ambulatory Visit: Payer: Self-pay | Admitting: Certified Nurse Midwife

## 2021-07-05 ENCOUNTER — Other Ambulatory Visit: Payer: Self-pay

## 2021-07-05 MED ORDER — LO LOESTRIN FE 1 MG-10 MCG / 10 MCG PO TABS: 1.0000 | ORAL_TABLET | Freq: Every day | ORAL | 0 refills | Status: DC

## 2021-07-05 MED ORDER — LO LOESTRIN FE 1 MG-10 MCG / 10 MCG PO TABS: 1.0000 | ORAL_TABLET | Freq: Every day | ORAL | 0 refills | Status: AC

## 2021-07-05 NOTE — Telephone Encounter (Signed)
You will need to schedule an appointment for further refills.

## 2021-10-25 ENCOUNTER — Other Ambulatory Visit: Payer: Self-pay | Admitting: Certified Nurse Midwife

## 2021-12-14 ENCOUNTER — Other Ambulatory Visit
Admission: RE | Admit: 2021-12-14 | Discharge: 2021-12-14 | Disposition: A | Discharge status: Home / Self Care | Payer: BC Managed Care – PPO | Source: Ambulatory Visit | Attending: Family | Admitting: Family

## 2021-12-14 ENCOUNTER — Encounter (INDEPENDENT_AMBULATORY_CARE_PROVIDER_SITE_OTHER): Payer: Self-pay

## 2021-12-14 ENCOUNTER — Ambulatory Visit (INDEPENDENT_AMBULATORY_CARE_PROVIDER_SITE_OTHER): Payer: BC Managed Care – PPO | Admitting: Family

## 2021-12-14 VITALS — BP 133/70 | HR 59 | Temp 98.0°F | Resp 16 | Ht 68.0 in | Wt 155.3 lb

## 2021-12-14 DIAGNOSIS — J029 Acute pharyngitis, unspecified: Secondary | ICD-10-CM

## 2021-12-14 LAB — VH AMB POCT SOFIA STREP A+ FIA: Sofia Rapid STrep A+ FIA POCT: NEGATIVE

## 2021-12-14 NOTE — Progress Notes (Signed)
Throat culture collected from patient by [facility]. This staff member prepared specimen, applied required charges and placed in courier box for lab courier to pick up.  Katheren Shams Jazmin Ley  11:02 AM

## 2021-12-14 NOTE — Progress Notes (Signed)
Subjective:    Patient ID:     Maria Castro is a 26 y.o. female who arrives today with    Chief Complaint   Patient presents with    Sore Throat     Started yesterday.     Otalgia     Bilateral ear pain     Denies any fever, congestion, cough, abdominal symptoms, or rashes.History of seasonal allergies. Denies any exposure to anyone who is sick. Tolerating food and fluids without difficulty.       The following portions of the patient's history were reviewed and updated as appropriate: allergies, current medications, past medical history, past surgical history and problem list.    Allergies   Allergen Reactions    Azithromycin Other (See Comments)     Other reaction(s): Unknown         Past Medical History:   Diagnosis Date    No known health problems        Past Surgical History:   Procedure Laterality Date    PARTIAL MASTECTOMY         Family History   Problem Relation Age of Onset    No known problems Mother     No known problems Father        Review of Systems   Constitutional:  Negative for chills, fatigue and fever.   HENT:  Positive for ear pain and sore throat. Negative for congestion and rhinorrhea.    Respiratory:  Negative for cough, shortness of breath and wheezing.    Cardiovascular:  Negative for chest pain.   Gastrointestinal:  Negative for abdominal pain, diarrhea, nausea and vomiting.   Skin:  Negative for rash.   Neurological:  Negative for dizziness, light-headedness and headaches.   Hematological:  Does not bruise/bleed easily.     Objective:     Vitals:    12/14/21 0937   BP: 133/70   Pulse: (!) 59   Resp: 16   Temp: 98 F (36.7 C)       Physical Exam  Vitals reviewed.   Constitutional:       General: She is not in acute distress.     Appearance: Normal appearance. She is well-developed.   HENT:      Right Ear: Tympanic membrane, ear canal and external ear normal.      Left Ear: Tympanic membrane, ear canal and external ear normal.      Nose: Nose normal.      Mouth/Throat:      Mouth: Mucous  membranes are moist.      Pharynx: Posterior oropharyngeal erythema present.   Eyes:      Conjunctiva/sclera: Conjunctivae normal.   Cardiovascular:      Rate and Rhythm: Normal rate and regular rhythm.      Heart sounds: Normal heart sounds.   Pulmonary:      Effort: Pulmonary effort is normal. No respiratory distress.      Breath sounds: Normal breath sounds. No wheezing.   Skin:     General: Skin is warm and dry.   Neurological:      Mental Status: She is alert and oriented to person, place, and time.   Psychiatric:         Mood and Affect: Mood normal.         Behavior: Behavior normal.        Lab Results from today's visit:  Results       Procedure Component Value Units Date/Time    Keenan Bachelor  Rapid Strep A+ FIA POCT [[id number]]  (Normal) Collected: 12/14/21 0943     Updated: 12/14/21 0951     POCT QC Pass     [facility] Rapid STrep A+ FIA POCT Negative            Assessment and Plan:   Maria Castro was seen today for sore throat and otalgia.    Diagnoses and all orders for this visit:    Pharyngitis, unspecified etiology  -     [facility] Rapid Strep A+ FIA POCT  -     Throat Culture; Future        PLAN:    Negative strep. Will send throat culture. Continue symptomatic therapy suggested: push fluids, rest, gargle warm salt water, use vaporizer or mist prn, use acetaminophen, ibuprofen. Plan was discussed with patient/parent and they verbalized understanding. If symptoms are not improving within the next couple of days, advised patient/parent to followup with primary care or return to the Urgent Care for further evaluation. Go to Emergency Department immediately for further work up if worsening symptoms or other medical concerns.    Oletta Lamas, NP  [facility]  12/14/2021 10:10 AM

## 2021-12-16 LAB — VH CULTURE, THROAT: Culture Result: NORMAL
